# Patient Record
Sex: Female | Born: 1980 | Hispanic: Yes | Smoking: Former smoker
Health system: Southern US, Community
[De-identification: ages and names within clinical notes are randomized; demographics above are authoritative.]

## PROBLEM LIST (undated history)

## (undated) DIAGNOSIS — E282 Polycystic ovarian syndrome: Secondary | ICD-10-CM

## (undated) DIAGNOSIS — R87619 Unspecified abnormal cytological findings in specimens from cervix uteri: Secondary | ICD-10-CM

## (undated) HISTORY — DX: Unspecified abnormal cytological findings in specimens from cervix uteri: R87.619

## (undated) HISTORY — DX: Polycystic ovarian syndrome: E28.2

---
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
## 2000-09-14 ENCOUNTER — Other Ambulatory Visit: Admission: RE | Admit: 2000-09-14 | Discharge: 2000-09-14 | Payer: Self-pay | Admitting: Family Medicine

## 2001-10-03 ENCOUNTER — Other Ambulatory Visit: Admission: RE | Admit: 2001-10-03 | Discharge: 2001-10-03 | Payer: Self-pay | Admitting: Family Medicine

## 2002-06-06 ENCOUNTER — Other Ambulatory Visit: Admission: RE | Admit: 2002-06-06 | Discharge: 2002-06-06 | Payer: Self-pay | Admitting: Obstetrics and Gynecology

## 2002-12-04 ENCOUNTER — Other Ambulatory Visit: Admission: RE | Admit: 2002-12-04 | Discharge: 2002-12-04 | Payer: Self-pay | Admitting: Obstetrics and Gynecology

## 2003-07-06 ENCOUNTER — Other Ambulatory Visit: Admission: RE | Admit: 2003-07-06 | Discharge: 2003-07-06 | Payer: Self-pay | Admitting: Obstetrics and Gynecology

## 2004-10-01 ENCOUNTER — Other Ambulatory Visit: Admission: RE | Admit: 2004-10-01 | Discharge: 2004-10-01 | Payer: Self-pay | Admitting: Obstetrics and Gynecology

## 2005-04-12 ENCOUNTER — Emergency Department (HOSPITAL_COMMUNITY): Admission: EM | Admit: 2005-04-12 | Discharge: 2005-04-12 | Payer: Self-pay | Admitting: Emergency Medicine

## 2012-11-02 DIAGNOSIS — R87619 Unspecified abnormal cytological findings in specimens from cervix uteri: Secondary | ICD-10-CM

## 2012-11-02 HISTORY — DX: Unspecified abnormal cytological findings in specimens from cervix uteri: R87.619

## 2012-11-02 HISTORY — PX: CERVICAL BIOPSY  W/ LOOP ELECTRODE EXCISION: SUR135

## 2012-11-02 HISTORY — PX: COLPOSCOPY: SHX161

## 2016-09-22 ENCOUNTER — Encounter: Payer: Self-pay | Admitting: Certified Nurse Midwife

## 2016-09-22 ENCOUNTER — Ambulatory Visit (INDEPENDENT_AMBULATORY_CARE_PROVIDER_SITE_OTHER): Payer: BLUE CROSS/BLUE SHIELD | Admitting: Certified Nurse Midwife

## 2016-09-22 VITALS — BP 110/68 | HR 68 | Resp 16 | Ht 66.75 in | Wt 150.0 lb

## 2016-09-22 DIAGNOSIS — Z01419 Encounter for gynecological examination (general) (routine) without abnormal findings: Secondary | ICD-10-CM

## 2016-09-22 DIAGNOSIS — Z124 Encounter for screening for malignant neoplasm of cervix: Secondary | ICD-10-CM

## 2016-09-22 DIAGNOSIS — Z1151 Encounter for screening for human papillomavirus (HPV): Secondary | ICD-10-CM | POA: Diagnosis not present

## 2016-09-22 DIAGNOSIS — N926 Irregular menstruation, unspecified: Secondary | ICD-10-CM | POA: Diagnosis not present

## 2016-09-22 DIAGNOSIS — L689 Hypertrichosis, unspecified: Secondary | ICD-10-CM | POA: Diagnosis not present

## 2016-09-22 DIAGNOSIS — Z0000 Encounter for general adult medical examination without abnormal findings: Secondary | ICD-10-CM

## 2016-09-22 LAB — POCT URINALYSIS DIPSTICK
Bilirubin, UA: NEGATIVE
Glucose, UA: NEGATIVE
Ketones, UA: NEGATIVE
LEUKOCYTES UA: NEGATIVE
NITRITE UA: NEGATIVE
PH UA: 5
PROTEIN UA: NEGATIVE
UROBILINOGEN UA: NEGATIVE

## 2016-09-22 LAB — TSH: TSH: 2.2 m[IU]/L

## 2016-09-22 NOTE — Progress Notes (Signed)
35 y.o. G0P0000 Married  Caucasian Fe here to establish gyn care and  for annual exam. Periods are usually normal, went 4 months without period and the past two months have been normal. Has cramping day 1-2 days,duration 4 days, heavy day 1-2 days. Patient has been traveling a lot. Has had hair loss since mid 20's and has noted it is still the same. Has seen dermatology for and chose not to do Rogaine. Would like to have screening labs if possible.  Tries to eat healthy and not boxed foods. Also has had increased body hair on face and breasts. Would like evaluation if possible. No other health issues today.   Patient's last menstrual period was 08/24/2016 (exact date).          Sexually active: Yes.    The current method of family planning is condoms all the time.    Exercising: Yes.    outdoor sports Smoker:  no  Health Maintenance: Pap:  06-11-14 neg, hx of LEEP 2014 MMG:  none Colonoscopy:  none BMD:   none TDaP:  2013 Shingles: no Pneumonia: no Hep C and HIV: not done Labs: hgb-12.1, poct urine-tr Self breast exam: done occ   reports that she has quit smoking. She has never used smokeless tobacco. She reports that she drinks about 0.6 - 1.2 oz of alcohol per week . She reports that she does not use drugs.  Past Medical History:  Diagnosis Date  . Abnormal Pap smear of cervix 2014    Past Surgical History:  Procedure Laterality Date  . CERVICAL BIOPSY  W/ LOOP ELECTRODE EXCISION  2014  . COLPOSCOPY  2014    No current outpatient prescriptions on file.   No current facility-administered medications for this visit.     Family History  Problem Relation Age of Onset  . Breast cancer Mother   . Thyroid disease Mother   . Hypertension Father   . Breast cancer Maternal Aunt   . Breast cancer Maternal Aunt     ROS:  Pertinent items are noted in HPI.  Otherwise, a comprehensive ROS was negative.  Exam:   BP 110/68   Pulse 68   Resp 16   Ht 5' 6.75" (1.695 m)   Wt 150 lb  (68 kg)   LMP 08/24/2016 (Exact Date)   BMI 23.67 kg/m  Height: 5' 6.75" (169.5 cm) Ht Readings from Last 3 Encounters:  09/22/16 5' 6.75" (1.695 m)    General appearance: alert, cooperative and appears stated age Head: Normocephalic, without obvious abnormality, atraumatic Neck: no adenopathy, supple, symmetrical, trachea midline and thyroid normal to inspection and palpation Lungs: clear to auscultation bilaterally Breasts: normal appearance, no masses or tenderness, No nipple retraction or dimpling, No nipple discharge or bleeding, No axillary or supraclavicular adenopathy, Taught monthly breast self examination Heart: regular rate and rhythm Abdomen: soft, non-tender; no masses,  no organomegaly Extremities: extremities normal, atraumatic, no cyanosis or edema Skin: Skin color, texture, turgor normal. No rashes or lesions Lymph nodes: Cervical, supraclavicular, and axillary nodes normal. No abnormal inguinal nodes palpated Neurologic: Grossly normal   Pelvic: External genitalia:  no lesions              Urethra:  normal appearing urethra with no masses, tenderness or lesions              Bartholin's and Skene's: normal                 Vagina: normal appearing vagina with  normal color and discharge, no lesions              Cervix: no bleeding following Pap, no cervical motion tenderness, no lesions and nulliparous appearance              Pap taken: Yes.   Bimanual Exam:  Uterus:  normal size, contour, position, consistency, mobility, non-tender              Adnexa: normal adnexa and no mass, fullness, tenderness               Rectovaginal: Confirms               Anus:  normal sphincter tone, no lesions  Chaperone present: yes  A:  Well Woman with normal exam  Contraception condoms working well.  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 Discussed weight and endocrine changes that can affect periods.  Questions addressed. Keep menses calendar and advise if no period in 3 months.  Labs TSH,FSH ,Prolactin, DHEA, Estradiol, Progesterone, female testosterone   Patient has records from when she had LEEP and will bring them by for [place] to copy for her record. Stressed importance of follow up pap.  Pap smear as above with HPVHR  counseled on breast self exam, adequate intake of calcium and vitamin D, diet and exercise  return annually or prn  An After Visit Summary was printed and given to the patient.

## 2016-09-22 NOTE — Patient Instructions (Signed)

## 2016-09-23 LAB — DHEA-SULFATE: DHEA-SO4: 122 ug/dL (ref 23–266)

## 2016-09-23 LAB — FOLLICLE STIMULATING HORMONE: FSH: 4.3 m[IU]/mL

## 2016-09-23 LAB — PROLACTIN: Prolactin: 24.7 ng/mL

## 2016-09-23 LAB — TESTOSTERONE, TOTAL, LC/MS/MS: Testosterone, Total, LC-MS-MS: 23 ng/dL (ref 2–45)

## 2016-09-23 LAB — ESTRADIOL: ESTRADIOL: 43 pg/mL

## 2016-09-25 LAB — 17-HYDROXYPROGESTERONE: 17-OH-Progesterone, LC/MS/MS: 103 ng/dL

## 2016-09-25 NOTE — Progress Notes (Signed)
Encounter reviewed Jill Jertson, MD   

## 2016-09-28 LAB — IPS PAP TEST WITH HPV

## 2016-09-28 LAB — HEMOGLOBIN, FINGERSTICK: Hemoglobin, fingerstick: 12.1 g/dL (ref 12.0–16.0)

## 2016-10-06 ENCOUNTER — Ambulatory Visit: Payer: Self-pay | Admitting: Nurse Practitioner

## 2016-10-09 ENCOUNTER — Telehealth: Payer: Self-pay | Admitting: Certified Nurse Midwife

## 2016-10-09 NOTE — Telephone Encounter (Signed)
Covering for DL - my suggestion is for her to go back on OCP to regulate her cycles.  Seems as though hair loss was since her 20's even while she was on OCP.  So not the cause of hair loss - (now with and without still has the same thing).  Still recommend PCP for the hair loss.  OCP will help regulate her cycles and decrease dysmenorrhea.  Did she come off OCP to try for a pregnancy - no mention of this in DL note.  All hormonal test were normal.  Does she remember the OCP she previously took?  Needs to be on a low dose pill.

## 2016-10-09 NOTE — Telephone Encounter (Signed)
Spoke with patient. Patient calling in reference to labs as seen below. Patient states it is weird everything came back normal, was hoping not to expect normal. Patient states she still has no answers to the symptoms that she discussed at last AEX. Patient states she went to dermatologist 6 months or so and nothing is different. RN asked if patient has a pcp, patient states she has an appt coming up in January -advised to keep this appt to establish care. Advised patient would review with Melvia Heaps, CNM for any additional recommendations and return call. Patient states she was regularly on OCP since 35 years old and stopped 3-60yrs ago and wonders if maybe this can be related to the symptoms currently experiencing. Advised patient I would return call with any additional recommendations. Patient is agreeable.  Melvia Heaps, CNM -please advise?   Notes Recorded by Regina Eck, CNM on 10/01/2016 at 7:43 AM EST Good Morning Amber Ware, Your lab profile is as follows: Your Progesterone level is normal Total Testerone for a woman is within normal range no concerns for hair growth FSH is normal for your age Prolactin level Is normal, no Pituitary gland concerns Estradiol level is normal DHEA is normal for a female, with no concerns with hair growth TSH is normal Pap smear is negative and HPVHR not detected no concerns Repeat pap smear in one year 02 Hgb. Is normal Recommend continued follow up with Dermatology regarding hair loss and treatment if desired. If you miss 3 periods consecutively you should advise, I would want you to have an exam and some lab work, so please keep a period record. Questions are always welcomed and can schedule consult to discuss. Have a great day [name]

## 2016-10-09 NOTE — Telephone Encounter (Signed)
Patient would like to speak with nurse about results.

## 2016-10-12 MED ORDER — NORGESTIM-ETH ESTRAD TRIPHASIC 0.18/0.215/0.25 MG-25 MCG PO TABS: 1.0000 | ORAL_TABLET | Freq: Every day | ORAL | 3 refills | Status: DC

## 2016-10-12 NOTE — Telephone Encounter (Signed)
Left message to call Amber Ware at [phone].  

## 2016-10-12 NOTE — Telephone Encounter (Signed)
I prefer the Ortho tri Cyclen Lo to keep the hormone levels low.  Give her instructions for restarting and BUM, potential SE., etc.  Refill until next AEX with DL.

## 2016-10-12 NOTE — Telephone Encounter (Signed)
Spoke with patient, advised as seen below per Kem Boroughs, NP. Review BUM and side effects of OCP. Patient verbalzies understanding and is agreeable.  Routing to provider for final review. Patient is agreeable to disposition. Will close encounter.  Cc: Melvia Heaps, CNM

## 2016-10-12 NOTE — Telephone Encounter (Signed)
Left message to call Taneil Lazarus at [phone].  

## 2016-10-12 NOTE — Telephone Encounter (Signed)
Patient is returning a call to Jill. °

## 2016-10-12 NOTE — Telephone Encounter (Signed)
Spoke with patient, advised as seen below. Patient states no reasons for coming off of the OCP other than she wanted to see how her body would do without it. Patient states she did not want to miss an underyling issue, but since all testing has come back normal, she would be ok with restarting OCP. Patient reports she has used Ortho-tri-cyclen and Ortho-tri-cyclenLo in the past. Advised patient RN would update Kem Boroughs, NP and return call with OCP recommendation. Verified pharmacy on file. Patient is agreeable.   Kem Boroughs, NP -please advise on OCP?

## 2016-11-09 ENCOUNTER — Encounter: Payer: Self-pay | Admitting: Nurse Practitioner

## 2016-11-09 ENCOUNTER — Ambulatory Visit (INDEPENDENT_AMBULATORY_CARE_PROVIDER_SITE_OTHER): Payer: BLUE CROSS/BLUE SHIELD | Admitting: Nurse Practitioner

## 2016-11-09 ENCOUNTER — Other Ambulatory Visit (INDEPENDENT_AMBULATORY_CARE_PROVIDER_SITE_OTHER): Payer: BLUE CROSS/BLUE SHIELD

## 2016-11-09 VITALS — BP 120/82 | HR 84 | Temp 98.4°F | Resp 12 | Ht 66.0 in | Wt 153.1 lb

## 2016-11-09 DIAGNOSIS — L689 Hypertrichosis, unspecified: Secondary | ICD-10-CM

## 2016-11-09 DIAGNOSIS — N926 Irregular menstruation, unspecified: Secondary | ICD-10-CM | POA: Diagnosis not present

## 2016-11-09 LAB — COMPREHENSIVE METABOLIC PANEL
ALT: 13 U/L (ref 0–35)
AST: 17 U/L (ref 0–37)
Albumin: 4.6 g/dL (ref 3.5–5.2)
Alkaline Phosphatase: 63 U/L (ref 39–117)
BILIRUBIN TOTAL: 0.6 mg/dL (ref 0.2–1.2)
BUN: 17 mg/dL (ref 6–23)
CHLORIDE: 105 meq/L (ref 96–112)
CO2: 29 meq/L (ref 19–32)
CREATININE: 0.73 mg/dL (ref 0.40–1.20)
Calcium: 10.4 mg/dL (ref 8.4–10.5)
GFR: 96.39 mL/min (ref >60.00)
GLUCOSE: 82 mg/dL (ref 70–99)
Potassium: 4.8 mEq/L (ref 3.5–5.1)
Sodium: 141 mEq/L (ref 135–145)
Total Protein: 7.7 g/dL (ref 6.0–8.3)

## 2016-11-09 LAB — CBC WITH DIFFERENTIAL/PLATELET
BASOS ABS: 0 10*3/uL (ref 0.0–0.1)
BASOS PCT: 0.3 % (ref 0.0–3.0)
EOS ABS: 0.1 10*3/uL (ref 0.0–0.7)
Eosinophils Relative: 1.1 % (ref 0.0–5.0)
HCT: 36.3 % (ref 36.0–46.0)
Hemoglobin: 12.7 g/dL (ref 12.0–15.0)
LYMPHS ABS: 1.5 10*3/uL (ref 0.7–4.0)
Lymphocytes Relative: 27.6 % (ref 12.0–46.0)
MCHC: 35 g/dL (ref 30.0–36.0)
MCV: 87.4 fl (ref 78.0–100.0)
Monocytes Absolute: 0.3 10*3/uL (ref 0.1–1.0)
Monocytes Relative: 6.5 % (ref 3.0–12.0)
NEUTROS ABS: 3.5 10*3/uL (ref 1.4–7.7)
NEUTROS PCT: 64.5 % (ref 43.0–77.0)
PLATELETS: 316 10*3/uL (ref 150.0–400.0)
RBC: 4.16 Mil/uL (ref 3.87–5.11)
RDW: 12.6 % (ref 11.5–15.5)
WBC: 5.4 10*3/uL (ref 4.0–10.5)

## 2016-11-09 LAB — TSH: TSH: 1.41 u[IU]/mL (ref 0.35–4.50)

## 2016-11-09 LAB — HEMOGLOBIN A1C: HEMOGLOBIN A1C: 5.2 % (ref 4.6–6.5)

## 2016-11-09 LAB — T4, FREE: FREE T4: 0.71 ng/dL (ref 0.60–1.60)

## 2016-11-09 NOTE — Patient Instructions (Addendum)
Go to basement for blood draw.  You will be called with results and appt for ultrasound.

## 2016-11-09 NOTE — Progress Notes (Signed)
Normal results, see office note

## 2016-11-09 NOTE — Progress Notes (Signed)
Pre visit review using our clinic review tool, if applicable. No additional management support is needed unless otherwise documented below in the visit note. 

## 2016-11-09 NOTE — Progress Notes (Signed)
Subjective:    Patient ID: Amber Ware, female    DOB: 1981-08-09, 36 y.o.   MRN: [id number]  Patient presents today for establish care (new patient) to discuss excess hair growth and irregular menstrual cycle.  HPI Irregular menstrual cycle: Used birth control pills x 66yr, then stopped 2years ago, had menstrual cycle for 4574month then became irregular. Last menstrual cycle was 74m51monthgo. Hormonal testing done by [facility] 09/2016: normal levels. No FH of PCOS. Does not know mother's age of menopause. Does not have any siblings.  Hair growth on stomach and legs have become coarse and excessive for last 2years. No abnormal weight loss or gain.  Immunizations: (TDAP, Hep C screen, Pneumovax, Influenza, zoster)  Health Maintenance  Topic Date Due  . HIV Screening  11/08/2017*  . Flu Shot  11/09/2017*  . Pap Smear  09/23/2019  . Tetanus Vaccine  11/02/2021  *Topic was postponed. The date shown is not the original due date.   Diet:healthy Weight:  Wt Readings from Last 3 Encounters:  11/09/16 153 lb 1.9 oz (69.5 kg)  09/22/16 150 lb (68 kg)   Exercise:daily Fall Risk:denies No flowsheet data found. Home Safety:home with husband Depression/Suicide:denies No flowsheet data found. No flowsheet data found. Pap Smear (every 29yr20yrr >21-29 without HPV, every 50yrs71yr >30-650yrs1yr HPV):up to date Vision: up to date Dental:up to date Sexual History (birth control, marital status, STD):sexually active, use of condoms at this time  Medications and allergies reviewed with patient and updated if appropriate.  There are no active problems to display for this patient.   Current Outpatient Prescriptions on File Prior to Visit  Medication Sig Dispense Refill  . Norgestimate-Ethinyl Estradiol Triphasic (ORTHO TRI-CYCLEN LO) 0.18/0.215/0.25 MG-25 MCG tab Take 1 tablet by mouth daily. (Patient not taking: Reported on 11/09/2016) 3 Package 3   No current facility-administered  medications on file prior to visit.     Past Medical History:  Diagnosis Date  . Abnormal Pap smear of cervix 2014    Past Surgical History:  Procedure Laterality Date  . CERVICAL BIOPSY  W/ LOOP ELECTRODE EXCISION  2014  . COLPOSCOPY  2014    Social History   Social History  . Marital status: Married    Spouse name: N/A  . Number of children: N/A  . Years of education: N/A   Social History Main Topics  . 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 Alcohol use 0.6 - 1.2 oz/week    1 - 2 Standard drinks or equivalent per week  . Drug use: No  . Sexual activity: Yes    Partners: Male    Birth control/ protection: Condom   Other Topics Concern  . None   Social History Narrative  . None    Family History  Problem Relation Age of Onset  . Breast cancer Mother 62    106ill alive  . Thyroid disease Mother   . Hypertension Father   . Breast cancer Maternal Aunt 50  . 23east cancer Maternal Aunt 55        Review of Systems  Constitutional: Negative for fever, malaise/fatigue and weight loss.  HENT: Negative for congestion and sore throat.   Eyes:       Negative for visual changes  Respiratory: Negative for cough and shortness of breath.   Cardiovascular: Negative for chest pain, palpitations and leg swelling.  Gastrointestinal: Negative for blood in stool, constipation, diarrhea and heartburn.  Genitourinary: Negative  for dysuria, frequency and urgency.  Musculoskeletal: Negative for falls, joint pain and myalgias.  Skin: Negative for rash.  Neurological: Negative for dizziness, sensory change and headaches.  Endo/Heme/Allergies: Negative for environmental allergies and polydipsia. Does not bruise/bleed easily.  Psychiatric/Behavioral: Negative for depression, substance abuse and suicidal ideas. The patient is not nervous/anxious and does not have insomnia.     Objective:   Vitals:   11/09/16 0903  BP: 120/82  Pulse: 84  Resp: 12    Temp: 98.4 F (36.9 C)    Body mass index is 24.71 kg/m.   Physical Examination:  Physical Exam  Constitutional: She is oriented to person, place, and time and well-developed, well-nourished, and in no distress. No distress.  HENT:  Right Ear: External ear normal.  Left Ear: External ear normal.  Nose: Nose normal.  Mouth/Throat: Oropharynx is clear and moist. No oropharyngeal exudate.  Eyes: Conjunctivae and EOM are normal. Pupils are equal, round, and reactive to light. No scleral icterus.  Neck: Normal range of motion. Neck supple. No thyromegaly present.  Cardiovascular: Normal rate, normal heart sounds and intact distal pulses.   Pulmonary/Chest: Effort normal and breath sounds normal. She exhibits no tenderness.  Abdominal: Soft. Bowel sounds are normal. She exhibits no distension. There is no tenderness.  Musculoskeletal: Normal range of motion. She exhibits no edema or tenderness.  Lymphadenopathy:    She has no cervical adenopathy.  Neurological: She is alert and oriented to person, place, and time. Gait normal.  Skin: Skin is warm and dry. No rash noted. No erythema.  Psychiatric: Affect and judgment normal.  Vitals reviewed.   ASSESSMENT and PLAN:  Amber Ware was seen today for establish care.  Diagnoses and all orders for this visit:  Irregular menstrual cycle -     Cancel: CBC w/Diff; Future -     Cancel: TSH; Future -     Cancel: T4, free; Future -     Cancel: Comp Met (CMET); Future -     Cancel: Hemoglobin A1c; Future -     US Transvaginal Non-OB; Future -     CBC w/Diff; Future -     Comp Met (CMET); Future -     TSH; Future -     T4, free; Future -     Hemoglobin A1c; Future -     US Pelvis Complete; Future  Excessive hair growth -     Cancel: CBC w/Diff; Future -     Cancel: TSH; Future -     Cancel: T4, free; Future -     Cancel: Comp Met (CMET); Future -     Cancel: Hemoglobin A1c; Future -     US Transvaginal Non-OB; Future -     CBC  w/Diff; Future -     Comp Met (CMET); Future -     TSH; Future -     T4, free; Future -     Hemoglobin A1c; Future -     US Pelvis Complete; Future   No problem-specific Assessment & Plan notes found for this encounter.     Follow up: Return in about 1 year (around 11/09/2017) for CPE.  Wilfred Lacy, NP

## 2016-11-10 ENCOUNTER — Ambulatory Visit
Admission: RE | Admit: 2016-11-10 | Discharge: 2016-11-10 | Disposition: A | Discharge status: Home / Self Care | Payer: BLUE CROSS/BLUE SHIELD | Source: Ambulatory Visit | Attending: Nurse Practitioner | Admitting: Nurse Practitioner

## 2016-11-10 DIAGNOSIS — L689 Hypertrichosis, unspecified: Secondary | ICD-10-CM | POA: Insufficient documentation

## 2016-11-10 DIAGNOSIS — N926 Irregular menstruation, unspecified: Secondary | ICD-10-CM | POA: Insufficient documentation

## 2016-11-13 ENCOUNTER — Encounter: Payer: Self-pay | Admitting: Certified Nurse Midwife

## 2016-11-13 ENCOUNTER — Other Ambulatory Visit: Payer: Self-pay | Admitting: Certified Nurse Midwife

## 2016-11-13 ENCOUNTER — Ambulatory Visit (INDEPENDENT_AMBULATORY_CARE_PROVIDER_SITE_OTHER): Payer: BLUE CROSS/BLUE SHIELD | Admitting: Certified Nurse Midwife

## 2016-11-13 VITALS — BP 96/62 | HR 68 | Resp 16 | Ht 66.75 in | Wt 149.0 lb

## 2016-11-13 DIAGNOSIS — N912 Amenorrhea, unspecified: Secondary | ICD-10-CM | POA: Diagnosis not present

## 2016-11-13 DIAGNOSIS — E282 Polycystic ovarian syndrome: Secondary | ICD-10-CM | POA: Diagnosis not present

## 2016-11-13 LAB — POCT URINE PREGNANCY: PREG TEST UR: NEGATIVE

## 2016-11-13 NOTE — Patient Instructions (Signed)
Please call if you start your period prior to lab results return. We will call you with results. [name]

## 2016-11-13 NOTE — Progress Notes (Signed)
36 y.o. Married Caucasian female G0P0000 here for follow up of cycle changes. Has not had period since 08/24/16. Patient was seen as new patient and had ran out pills and not restarted due to amenorrhea. Was see for aex with PCP and was evaluated 11/09/16 with PUS and labs. Patient was told to follow up with Gyn. Patient has felt like she may start a period, but no bleeding.Contraception consistent condom use and negative UPT today. Does not want to be pregnant. PUS showed PCOS characteristics and patient would like to be back with normal cycles which she had with OCP use. Extra hair growth still continues but no increase. No other health concerns today.   O: Healthy WD,WN female Affect: Normal, orientation x 3 Skin: warm and dry Abdomen:soft, non tender Pelvic exam:EXTERNAL GENITALIA: normal appearing vulva with no masses, tenderness or lesions VAGINA: no abnormal discharge or lesions CERVIX: no lesions or cervical motion tenderness and normal appearance UTERUS: non tender, no masses, normal ADNEXA: no masses palpable and nontender  A:Amenorrhea negative UPT Recent PUS with PCOS characteristics noted with ovaries Hirsuitism History of irregular periods without OCP use, desires OCP for contraception Screening labs as needed.   P: Discussed findings of normal pelvic exam. Discussed findings of PUS and PCOS(reviewed by Dr. Sabra Heck with PCOS characteristics on images) and she has characteristics also with excessive hair growth and irregular periods. Questions addressed regarding management with OCP for amenorrhea episodes and possible Spiralactone use with OCP for help with hair control. Discussed sometimes Glucophage needed for concerns with glucose. Her screening was normal, so not indicated at this point.  Primary concern at this point is to make sure no risk of hyperplasia with no menses in 3 months. PUS showed thickened endometrium, 7.42mm so will need to go ahead with Provera challenge and with  bleeding can restart OCP. Patient voiced understanding.  Questions addressed. Lab: Free and total testosterone female  Will call patient with further recommendations when lab in.  RV prn

## 2016-11-16 LAB — TESTOSTERONE, FREE AND TOTAL (INCLUDES SHBG)-(MALES)
SEX HORMONE BINDING: 39 nmol/L (ref 17–124)
TESTOSTERONE FREE: 3.7 pg/mL (ref 0.6–6.8)
TESTOSTERONE-% FREE: 1.6 % (ref 0.4–2.4)
TESTOSTERONE: 23 ng/dL

## 2016-11-17 ENCOUNTER — Telehealth: Payer: Self-pay

## 2016-11-17 ENCOUNTER — Other Ambulatory Visit: Payer: Self-pay | Admitting: Certified Nurse Midwife

## 2016-11-17 DIAGNOSIS — N912 Amenorrhea, unspecified: Secondary | ICD-10-CM

## 2016-11-17 DIAGNOSIS — Z3202 Encounter for pregnancy test, result negative: Secondary | ICD-10-CM

## 2016-11-17 MED ORDER — MEDROXYPROGESTERONE ACETATE 10 MG PO TABS: 10.0000 mg | ORAL_TABLET | Freq: Every day | ORAL | 0 refills | Status: DC

## 2016-11-17 NOTE — Telephone Encounter (Signed)
-----   Message from Regina Eck, CNM sent at 11/17/2016  3:04 PM EST ----- Notify patient that testosterone no change since last evaluation, free testosterone normal range Need do Provera challenge to see if we can stimulate period. Rx Provera 10 mg for 10 days. When bleeding starts needs to advise so she can start on her OCP's. No order in yet. If  No bleeding before by 2 weeks after completion, needs to advise. Rx Provera order in. Please give instructions. Make sure she has used consistent condoms.

## 2016-11-17 NOTE — Telephone Encounter (Signed)
lmtcb

## 2016-11-19 NOTE — Progress Notes (Signed)
Encounter reviewed Jill Jertson, MD   

## 2016-11-20 NOTE — Telephone Encounter (Signed)
Patient returned call

## 2016-11-20 NOTE — Telephone Encounter (Signed)
Left message for call back.

## 2016-11-20 NOTE — Telephone Encounter (Signed)
Patient notified of results as written by provider 

## 2016-12-03 ENCOUNTER — Telehealth: Payer: Self-pay | Admitting: Certified Nurse Midwife

## 2016-12-03 NOTE — Telephone Encounter (Signed)
Spoke with patient. Patient states she was started on provera 11/17/16 to help start cycle. Patient states cycle started today. Patient would like to know if she should restart OrthoTri-cyclen Lo that she already has or will she need to change OCP? Patient states she talked with Melvia Heaps, CNM about possibly switching. Patient sates she has used condoms for contraception. Advised patient would review with Melvia Heaps, CNM and return call with recommendations, patient is agreeable. See telephone encounter dated 11/17/16.   Melvia Heaps, CNM- please advise?

## 2016-12-03 NOTE — Telephone Encounter (Signed)
She can go ahead and restart her Ortho Tricyclen Lo which has a low androgen content, which will help with increase hair growth change. Does she need refills. Needs to advise if no period with OCP use after the second pack of pills.

## 2016-12-03 NOTE — Telephone Encounter (Signed)
Spoke with patient. Advised as seen below per Melvia Heaps, CNM. Patient states she has 3 pkgs of Ortho Tricyclen Lo, will start with that and return call when refill needed or if no cycle. Patient verbalizes understanding and is agreeable.  Routing to provider for final review. Patient is agreeable to disposition. Will close encounter.

## 2016-12-03 NOTE — Telephone Encounter (Signed)
Patient is calling to give an update.

## 2017-09-28 ENCOUNTER — Other Ambulatory Visit: Payer: Self-pay

## 2017-09-28 ENCOUNTER — Encounter: Payer: Self-pay | Admitting: Certified Nurse Midwife

## 2017-09-28 ENCOUNTER — Ambulatory Visit (INDEPENDENT_AMBULATORY_CARE_PROVIDER_SITE_OTHER): Payer: BLUE CROSS/BLUE SHIELD | Admitting: Certified Nurse Midwife

## 2017-09-28 VITALS — BP 100/70 | HR 64 | Resp 16 | Ht 66.5 in | Wt 157.0 lb

## 2017-09-28 DIAGNOSIS — Z8669 Personal history of other diseases of the nervous system and sense organs: Secondary | ICD-10-CM | POA: Diagnosis not present

## 2017-09-28 DIAGNOSIS — Z3041 Encounter for surveillance of contraceptive pills: Secondary | ICD-10-CM

## 2017-09-28 DIAGNOSIS — Z01419 Encounter for gynecological examination (general) (routine) without abnormal findings: Secondary | ICD-10-CM | POA: Diagnosis not present

## 2017-09-28 DIAGNOSIS — R6882 Decreased libido: Secondary | ICD-10-CM | POA: Insufficient documentation

## 2017-09-28 NOTE — Patient Instructions (Addendum)
EXERCISE AND DIET:  We recommended that you start or continue a regular exercise program for good health. Regular exercise means any activity that makes your heart beat faster and makes you sweat.  We recommend exercising at least 30 minutes per day at least 3 days a week, preferably 4 or 5.  We also recommend a diet low in fat and sugar.  Inactivity, poor dietary choices and obesity can cause diabetes, heart attack, stroke, and kidney damage, among others.    ALCOHOL AND SMOKING:  Women should limit their alcohol intake to no more than 7 drinks/beers/glasses of wine (combined, not each!) per week. Moderation of alcohol intake to this level decreases your risk of breast cancer and liver damage. And of course, no recreational drugs are part of a healthy lifestyle.  And absolutely no smoking or even second hand smoke. Most people know smoking can cause heart and lung diseases, but did you know it also contributes to weakening of your bones? Aging of your skin?  Yellowing of your teeth and nails?  CALCIUM AND VITAMIN D:  Adequate intake of calcium and Vitamin D are recommended.  The recommendations for exact amounts of these supplements seem to change often, but generally speaking 600 mg of calcium (either carbonate or citrate) and 800 units of Vitamin D per day seems prudent. Certain women may benefit from higher intake of Vitamin D.  If you are among these women, your doctor will have told you during your visit.    PAP SMEARS:  Pap smears, to check for cervical cancer or precancers,  have traditionally been done yearly, although recent scientific advances have shown that most women can have pap smears less often.  However, every woman still should have a physical exam from her gynecologist every year. It will include a breast check, inspection of the vulva and vagina to check for abnormal growths or skin changes, a visual exam of the cervix, and then an exam to evaluate the size and shape of the uterus and  ovaries.  And after 36 years of age, a rectal exam is indicated to check for rectal cancers. We will also provide age appropriate advice regarding health maintenance, like when you should have certain vaccines, screening for sexually transmitted diseases, bone density testing, colonoscopy, mammograms, etc.   MAMMOGRAMS:  All women over 40 years old should have a yearly mammogram. Many facilities now offer a "3D" mammogram, which may cost around $50 extra out of pocket. If possible,  we recommend you accept the option to have the 3D mammogram performed.  It both reduces the number of women who will be called back for extra views which then turn out to be normal, and it is better than the routine mammogram at detecting truly abnormal areas.    COLONOSCOPY:  Colonoscopy to screen for colon cancer is recommended for all women at age 50.  We know, you hate the idea of the prep.  We agree, BUT, having colon cancer and not knowing it is worse!!  Colon cancer so often starts as a polyp that can be seen and removed at colonscopy, which can quite literally save your life!  And if your first colonoscopy is normal and you have no family history of colon cancer, most women don't have to have it again for 10 years.  Once every ten years, you can do something that may end up saving your life, right?  We will be happy to help you get it scheduled when you are ready.    Be sure to check your insurance coverage so you understand how much it will cost.  It may be covered as a preventative service at no cost, but you should check your particular policy.      Migraine Headache A migraine headache is a very strong throbbing pain on one side or both sides of your head. Migraines can also cause other symptoms. Talk with your doctor about what things may bring on (trigger) your migraine headaches. Follow these instructions at home: Medicines  Take over-the-counter and prescription medicines only as told by your doctor.  Do not  drive or use heavy machinery while taking prescription pain medicine.  To prevent or treat constipation while you are taking prescription pain medicine, your doctor may recommend that you: ? Drink enough fluid to keep your pee (urine) clear or pale yellow. ? Take over-the-counter or prescription medicines. ? Eat foods that are high in fiber. These include fresh fruits and vegetables, whole grains, and beans. ? Limit foods that are high in fat and processed sugars. These include fried and sweet foods. Lifestyle  Avoid alcohol.  Do not use any products that contain nicotine or tobacco, such as cigarettes and e-cigarettes. If you need help quitting, ask your doctor.  Get at least 8 hours of sleep every night.  Limit your stress. General instructions   Keep a journal to find out what may bring on your migraines. For example, write down: ? What you eat and drink. ? How much sleep you get. ? Any change in what you eat or drink. ? Any change in your medicines.  If you have a migraine: ? Avoid things that make your symptoms worse, such as bright lights. ? It may help to lie down in a dark, quiet room. ? Do not drive or use heavy machinery. ? Ask your doctor what activities are safe for you.  Keep all follow-up visits as told by your doctor. This is important. Contact a doctor if:  You get a migraine that is different or worse than your usual migraines. Get help right away if:  Your migraine gets very bad.  You have a fever.  You have a stiff neck.  You have trouble seeing.  Your muscles feel weak or like you cannot control them.  You start to lose your balance a lot.  You start to have trouble walking.  You pass out (faint). This information is not intended to replace advice given to you by your health care provider. Make sure you discuss any questions you have with your health care provider. Document Released: 07/28/2008 Document Revised: 05/08/2016 Document Reviewed:  04/06/2016 Elsevier Interactive Patient Education  2017 [employer].

## 2017-09-28 NOTE — Progress Notes (Signed)
36 y.o. G0P0000 Married  Caucasian Fe here for annual exam.  Contraception OCP working well. No missed periods or missed pills. Cycles so much better.Amber Ware PCP [facility] prn. Has noted increase in headache occurrence random. Now having migraines every 4 months again. No aura ever. Maybe stress related. Having more muscle/joint pain with standing at work. Plans to try Massage therapy for head and neck muscle strain. Feels this maybe cause of her headaches. Eating well and good fluid intake and no excessive caffeine use. No other health issues today.   LMP 08/26/17 Sexually active: Yes.    The current method of family planning is OCP (estrogen/progesterone).    Exercising: Yes.    walk & run Smoker:  no  Health Maintenance: Pap:  06-11-14 neg, 09-22-16 neg HPV HR neg History of Abnormal Pap: yes LEEP MMG:  none Self Breast exams: yes Colonoscopy:  none BMD:   none TDaP:  2013 Shingles: no Pneumonia: no Hep C and HIV: not done Labs: if needed   reports that she has quit smoking. she has never used smokeless tobacco. She reports that she does not drink alcohol or use drugs.  Past Medical History:  Diagnosis Date  . Abnormal Pap smear of cervix 2014  . Polycystic ovarian syndrome     Past Surgical History:  Procedure Laterality Date  . CERVICAL BIOPSY  W/ LOOP ELECTRODE EXCISION  2014  . COLPOSCOPY  2014    Current Outpatient Medications  Medication Sig Dispense Refill  . Norgestim-Eth Estrad Triphasic (ORTHO TRI-CYCLEN LO PO) Take by mouth.     No current facility-administered medications for this visit.     Family History  Problem Relation Age of Onset  . Breast cancer Mother 24       still alive  . Thyroid disease Mother   . Hypertension Father   . Breast cancer Maternal Aunt 101  . Breast cancer Maternal Aunt 55    ROS:  Pertinent items are noted in HPI.  Otherwise, a comprehensive ROS was negative.  Exam:   BP 100/70   Pulse 64   Resp 16   Ht 5' 6.5"  (1.689 m)   Wt 157 lb (71.2 kg)   LMP  (Approximate) Comment: last week of the october  BMI 24.96 kg/m  Height: 5' 6.5" (168.9 cm) Ht Readings from Last 3 Encounters:  09/28/17 5' 6.5" (1.689 m)  11/13/16 5' 6.75" (1.695 m)  11/09/16 5\' 6"  (1.676 m)    General appearance: alert, cooperative and appears stated age Head: Normocephalic, without obvious abnormality, atraumatic Neck: no adenopathy, supple, symmetrical, trachea midline and thyroid normal to inspection and palpation Lungs: clear to auscultation bilaterally Breasts: normal appearance, no masses or tenderness, No nipple retraction or dimpling, No nipple discharge or bleeding, No axillary or supraclavicular adenopathy Heart: regular rate and rhythm Abdomen: soft, non-tender; no masses,  no organomegaly Extremities: extremities normal, atraumatic, no cyanosis or edema Skin: Skin color, texture, turgor normal. No rashes or lesions Lymph nodes: Cervical, supraclavicular, and axillary nodes normal. No abnormal inguinal nodes palpated Neurologic: Grossly normal   Pelvic: External genitalia:  no lesions              Urethra:  normal appearing urethra with no masses, tenderness or lesions              Bartholin's and Skene's: normal                 Vagina: normal appearing vagina with normal color  and discharge, no lesions              Cervix: no cervical motion tenderness, no lesions and nulliparous appearance              Pap taken: No. Bimanual Exam:  Uterus:  normal size, contour, position, consistency, mobility, non-tender              Adnexa: normal adnexa and no mass, fullness, tenderness               Rectovaginal: Confirms               Anus:  normal sphincter tone, no lesions  Chaperone present: yes  A:  Well Woman with normal exam  Contraception OCP desires continuance  Migraine headache no aura history, ? Muscle headache now with prolonged standing  History of LEEP with abnormal pap  P:   Reviewed health and  wellness pertinent to exam  Risks/benefits/warning signs reviewed .  Rx Ortho tricyclen lo see order with instructions  Discussed evaluation of headaches if continue or therapeutic massage trial. Warning signs of migraine given and need to advise if occurs or see PCP.  Pap smear: no   counseled on breast self exam, use and side effects of OCP's, adequate intake of calcium and vitamin D, diet and exercise  return annually or prn  An After Visit Summary was printed and given to the patient.

## 2017-10-15 ENCOUNTER — Other Ambulatory Visit: Payer: Self-pay

## 2017-10-15 NOTE — Telephone Encounter (Signed)
Medication refill request: ORTHO TRI-CYCLEN LO Last AEX:  09/28/17 DL Next AEX: 10/13/18 Last MMG (if hormonal medication request): n/a Refill authorized: please advise on refill

## 2017-10-16 MED ORDER — NORGESTIM-ETH ESTRAD TRIPHASIC 0.18/0.215/0.25 MG-25 MCG PO TABS: 1.0000 | ORAL_TABLET | Freq: Every day | ORAL | 4 refills | Status: DC

## 2018-01-14 ENCOUNTER — Ambulatory Visit (INDEPENDENT_AMBULATORY_CARE_PROVIDER_SITE_OTHER): Payer: BLUE CROSS/BLUE SHIELD | Admitting: Nurse Practitioner

## 2018-01-14 ENCOUNTER — Encounter: Payer: Self-pay | Admitting: Nurse Practitioner

## 2018-01-14 VITALS — BP 124/80 | HR 94 | Temp 98.6°F | Resp 16 | Ht 66.5 in | Wt 159.1 lb

## 2018-01-14 DIAGNOSIS — R519 Headache, unspecified: Secondary | ICD-10-CM

## 2018-01-14 DIAGNOSIS — M79606 Pain in leg, unspecified: Secondary | ICD-10-CM

## 2018-01-14 DIAGNOSIS — F419 Anxiety disorder, unspecified: Secondary | ICD-10-CM

## 2018-01-14 DIAGNOSIS — F329 Major depressive disorder, single episode, unspecified: Secondary | ICD-10-CM

## 2018-01-14 DIAGNOSIS — F32A Depression, unspecified: Secondary | ICD-10-CM | POA: Insufficient documentation

## 2018-01-14 DIAGNOSIS — M542 Cervicalgia: Secondary | ICD-10-CM | POA: Diagnosis not present

## 2018-01-14 DIAGNOSIS — R51 Headache: Secondary | ICD-10-CM

## 2018-01-14 DIAGNOSIS — F411 Generalized anxiety disorder: Secondary | ICD-10-CM | POA: Insufficient documentation

## 2018-01-14 MED ORDER — CYCLOBENZAPRINE HCL 5 MG PO TABS: 5.0000 mg | ORAL_TABLET | Freq: Three times a day (TID) | ORAL | 0 refills | Status: DC | PRN

## 2018-01-14 MED ORDER — SERTRALINE HCL 50 MG PO TABS: 50.0000 mg | ORAL_TABLET | Freq: Every day | ORAL | 1 refills | Status: DC

## 2018-01-14 NOTE — Assessment & Plan Note (Signed)
We discussed that her untreated anxiety and depression may be contributing to her headaches and overall not feeling well. Discussed trialing SSRI and referral to counseling to treat anxiety and depression and she was agreeable Will try zoloft-dosing and side effects discussed We also discussed nonpharm techniques to manage hypertension-See AVS for additional information provided to patient RTC in 1 month for follow up - sertraline (ZOLOFT) 50 MG tablet; Take 1 tablet (50 mg total) by mouth daily.  Dispense: 30 tablet; Refill: 1 - Ambulatory referral to Psychology

## 2018-01-14 NOTE — Progress Notes (Signed)
Name: Amber Ware   MRN: [id number]    DOB: 1981-07-06   Date:01/14/2018       Progress Note  Subjective  Chief Complaint  Chief Complaint  Patient presents with  . Establish Care    aches and pains in legs, veins in legs, headaches more often    HPI Ms Amber Ware is establishing care with me today, transferring from a prior provider who left our clinic. She is requesting evaluation of acute complaints today including anxiety and depression, headache, leg pain.  Anxiety and depression- This is a chronic problem. She actually saw a psychiatrist in her 6's for anxiety and depression and was given samples of several medications to try but she did not like the way any of the medications made her feel. She says she was a heavy alcohol and recreational drug user at that time In her life and felt the psych medications just made her feel worse. She says she has noticed increase in her anxiety and depression this year after moving to [place] from [place]. She says she did not want to move to [place] and does not really enjoy living here but had to move. She says she feels hopeless, has no energy and little interest in doing things on a daily basis. She denies restlessness, hallucinations, paranoia, thoughts of hurting herself or others.  Depression screen [facility] 2/9 01/14/2018 01/14/2018  Decreased Interest 3 0  Down, Depressed, Hopeless 3 0  PHQ - 2 Score 6 0  Altered sleeping 1 -  Tired, decreased energy 2 -  Change in appetite 0 -  Feeling bad or failure about yourself  1 -  Trouble concentrating 2 -  Moving slowly or fidgety/restless 0 -  Suicidal thoughts 0 -  PHQ-9 Score 12 -   GAD 7 : Generalized Anxiety Score 01/14/2018  Nervous, Anxious, on Edge 1  Control/stop worrying 0  Worry too much - different things 0  Trouble relaxing 1  Restless 0  Easily annoyed or irritable 3  Afraid - awful might happen 0  Total GAD 7 Score 5   Headache- This is not a new problem She has  experienced intermittent headaches throughout her life For the past 1-2 years headaches have seemed more frequent- about 1-2 headaches per week She describes the headaches as a constant dull pain in the right side and back of her head. The headaches are worse with light, noise and make her feel nauseated. Tried warm packs, aleve, massage with no relief. The headaches will resolve with time-after lying in a quiet dark room. She has been diagnosed with migraines in the past but has not been on medication for migraines. Shes also had pain in her right neck for several weeks now which she describes as tight muscle spasm She denies any fevers, confusion, syncope, dizziness, vision changes, sinus pressure, vomiting.  Leg pain- This is a chronic problem. This problem has increased over past year. She describes as a heaviness and aching in both of her legs The pain is worse after exercise or at the end of a long shift standing. She reports varicose and spider veins to her legs She has been wearing compression tights with some relief. She denies fevers, shortness of breath, back pain, erythema, bruising, warmth, leg swelling.   Patient Active Problem List   Diagnosis Date Noted  . Decreased sex drive 61/44/3154  . Excessive hair growth 11/10/2016  . Irregular menstrual cycle 11/10/2016    Past Surgical History:  Procedure Laterality Date  .  CERVICAL BIOPSY  W/ LOOP ELECTRODE EXCISION  2014  . COLPOSCOPY  2014    Family History  Problem Relation Age of Onset  . Breast cancer Mother 40       still alive  . Thyroid disease Mother   . Hypertension Father   . Breast cancer Maternal Aunt 75  . Breast cancer Maternal Aunt 47    Social History   Socioeconomic History  . Marital status: Married    Spouse name: Not on file  . Number of children: Not on file  . Years of education: Not on file  . Highest education level: Not on file  Social Needs  . Financial resource strain: Not on  file  . Food insecurity - worry: Not on file  . Food insecurity - inability: Not on file  . Transportation needs - medical: Not on file  . Transportation needs - non-medical: Not on file  Occupational History  . Not on file  Tobacco Use  . Smoking status: Former Research scientist (life sciences)  . Smokeless tobacco: Never Used  Substance and Sexual Activity  . Alcohol use: No    Frequency: Never  . Drug use: No  . Sexual activity: Yes    Partners: Male    Birth control/protection: Pill  Other Topics Concern  . Not on file  Social History Narrative  . Not on file     Current Outpatient Medications:  .  Norgestimate-Ethinyl Estradiol Triphasic (ORTHO TRI-CYCLEN LO) 0.18/0.215/0.25 MG-25 MCG tab, Take 1 tablet by mouth daily., Disp: 3 Package, Rfl: 4 .  cyclobenzaprine (FLEXERIL) 5 MG tablet, Take 1 tablet (5 mg total) by mouth 3 (three) times daily as needed for muscle spasms., Disp: 30 tablet, Rfl: 0 .  sertraline (ZOLOFT) 50 MG tablet, Take 1 tablet (50 mg total) by mouth daily., Disp: 30 tablet, Rfl: 1  No Known Allergies   ROS See HPI  Objective  Vitals:   01/14/18 1312  BP: 124/80  Pulse: 94  Resp: 16  Temp: 98.6 F (37 C)  TempSrc: Oral  SpO2: 96%  Weight: 159 lb 1.9 oz (72.2 kg)  Height: 5' 6.5" (1.689 m)   Body mass index is 25.3 kg/m.  Physical Exam Vital signs reviewed. Constitutional: Patient appears well-developed and well-nourished. No distress.  HENT: Head: Normocephalic and atraumatic. Nose: Nose normal. Mouth/Throat: Oropharynx is clear and moist. Eyes: Conjunctivae and EOM are normal. Pupils are equal, round, and reactive to light. No scleral icterus.  Neck: Normal range of motion. Neck supple.  No thyromegaly present.  Cardiovascular: Normal rate, regular rhythm and normal heart sounds.  No murmur heard. No BLE edema. Distal pulses intact. Subcutaneous telangiectasias and reticular veins to bilateral lower extremities. Pulmonary/Chest: Effort normal and breath sounds  normal. No respiratory distress. Musculoskeletal: Normal range of motion, no joint effusions. No gross deformities Neurological: She is alert and oriented to person, place, and time. No cranial nerve deficit. Coordination, balance, strength, speech and gait are normal.  Skin: Skin is warm and dry. No rash noted. No erythema.  Psychiatric: Patient appears anxious. Affect is normal. Judgment and thought content normal.  PHQ2/9: Depression screen [facility] 2/9 01/14/2018 01/14/2018  Decreased Interest 3 0  Down, Depressed, Hopeless 3 0  PHQ - 2 Score 6 0  Altered sleeping 1 -  Tired, decreased energy 2 -  Change in appetite 0 -  Feeling bad or failure about yourself  1 -  Trouble concentrating 2 -  Moving slowly or fidgety/restless 0 -  Suicidal  thoughts 0 -  PHQ-9 Score 12 -    Fall Risk: Fall Risk  01/14/2018  Falls in the past year? No    Assessment & Plan RTC in 1 month for F/U of anxiety and depression-starting zoloft; headaches; neck pain  Nonintractable headache, unspecified chronicity pattern, unspecified headache type ?migraines We discussed that her anxiety and muscle spasm could be triggering migraine- will begin treatment for these problems today and see if migraines improve RTC in 1 month for follow up. Discussed sooner follow up if needed Will consider migraine treatment, referral to neuro for continued migraines. - cyclobenzaprine (FLEXERIL) 5 MG tablet; Take 1 tablet (5 mg total) by mouth 3 (three) times daily as needed for muscle spasms.  Dispense: 30 tablet; Refill: 0  Pain of lower extremity, unspecified laterality We discussed referral to vein and vascular for further evaluation of leg pain and varicose veins - Ambulatory referral to Vascular Surgery  Neck pain - cyclobenzaprine (FLEXERIL) 5 MG tablet; Take 1 tablet (5 mg total) by mouth 3 (three) times daily as needed for muscle spasms.  Dispense: 30 tablet; Refill: 0

## 2018-01-14 NOTE — Patient Instructions (Addendum)
I have sent a prescription for zoloft 50mg  tablets to your pharmacy. Please start 1/2 tablet once daily for 1 week and then increase to a full tablet once daily on week two as tolerated.  Some side effects such as nausea, drowsiness and weight gain can occur.  Also rarely people have experienced suicidal thoughts when taking this medication.  Please discontinue the medication and go directly to ED if this occurs.  Id like to see you back in about 1 month to evaluate progress.    I have sent a prescription for flexeril 5mg  tablets for your neck pain. You can take this up to 3 times a day for your neck pain. This medication can make you drowsy. Do not operate machinery or drink alcohol when you take this medication.  I have placed a referral to counseling and vascular . Our office will call you to schedule this appointment. You should hear from our office in 7-10 days.  Please return in about 1 month so we can see how you are doing on the zoloft.  It was good to meet you. Thanks for letting me take care of you today :)  Mindfulness-Based Stress Reduction Mindfulness-based stress reduction (MBSR) is a program that helps people learn to practice mindfulness. Mindfulness is the practice of intentionally paying attention to the present moment. It can be learned and practiced through techniques such as education, breathing exercises, meditation, and yoga. MBSR includes several mindfulness techniques in one program. MBSR works best when you understand the treatment, are willing to try new things, and can commit to spending time practicing what you learn. MBSR training may include learning about:  How your emotions, thoughts, and reactions affect your body.  New ways to respond to things that cause negative thoughts to start (triggers).  How to notice your thoughts and let go of them.  Practicing awareness of everyday things that you normally do without thinking.  The techniques and goals of different  types of meditation.  What are the benefits of MBSR? MBSR can have many benefits, which include helping you to:  Develop self-awareness. This refers to knowing and understanding yourself.  Learn skills and attitudes that help you to participate in your own health care.  Learn new ways to care for yourself.  Be more accepting about how things are, and let things go.  Be less judgmental and approach things with an open mind.  Be patient with yourself and trust yourself more.  MBSR has also been shown to:  Reduce negative emotions, such as depression and anxiety.  Improve memory and focus.  Change how you sense and approach pain.  Boost your body's ability to fight infections.  Help you connect better with other people.  Improve your sense of well-being.  Follow these instructions at home:  Find a local in-person or online MBSR program.  Set aside some time regularly for mindfulness practice.  Find a mindfulness practice that works best for you. This may include one or more of the following: ? Meditation. Meditation involves focusing your mind on a certain thought or activity. ? Breathing awareness exercises. These help you to stay present by focusing on your breath. ? Body scan. For this practice, you lie down and pay attention to each part of your body from head to toe. You can identify tension and soreness and intentionally relax parts of your body. ? Yoga. Yoga involves stretching and breathing, and it can improve your ability to move and be flexible. It can  also provide an experience of testing your body's limits, which can help you release stress. ? Mindful eating. This way of eating involves focusing on the taste, texture, color, and smell of each bite of food. Because this slows down eating and helps you feel full sooner, it can be an important part of a weight-loss plan.  Find a podcast or recording that provides guidance for breathing awareness, body scan, or  meditation exercises. You can listen to these any time when you have a free moment to rest without distractions.  Follow your treatment plan as told by your health care provider. This may include taking regular medicines and making changes to your diet or lifestyle as recommended. How to practice mindfulness To do a basic awareness exercise:  Find a comfortable place to sit.  Pay attention to the present moment. Observe your thoughts, feelings, and surroundings just as they are.  Avoid placing judgment on yourself, your feelings, or your surroundings. Make note of any judgment that comes up, and let it go.  Your mind may wander, and that is okay. Make note of when your thoughts drift, and return your attention to the present moment.  To do basic mindfulness meditation:  Find a comfortable place to sit. This may include a stable chair or a firm floor cushion. ? Sit upright with your back straight. Let your arms fall next to your side with your hands resting on your legs. ? If sitting in a chair, rest your feet flat on the floor. ? If sitting on a cushion, cross your legs in front of you.  Keep your head in a neutral position with your chin dropped slightly. Relax your jaw and rest the tip of your tongue on the roof of your mouth. Drop your gaze to the floor. You can close your eyes if you like.  Breathe normally and pay attention to your breath. Feel the air moving in and out of your nose. Feel your belly expanding and relaxing with each breath.  Your mind may wander, and that is okay. Make note of when your thoughts drift, and return your attention to your breath.  Avoid placing judgment on yourself, your feelings, or your surroundings. Make note of any judgment or feelings that come up, let them go, and bring your attention back to your breath.  When you are ready, lift your gaze or open your eyes. Pay attention to how your body feels after the meditation.  Where to find more  information: You can find more information about MBSR from:  Your health care provider.  Community-based meditation centers or programs.  Programs offered near you.  Summary  Mindfulness-based stress reduction (MBSR) is a program that teaches you how to intentionally pay attention to the present moment. It is used with other treatments to help you cope better with daily stress, emotions, and pain.  MBSR focuses on developing self-awareness, which allows you to respond to life stress without judgment or negative emotions.  MBSR programs may involve learning different mindfulness practices, such as breathing exercises, meditation, yoga, body scan, or mindful eating. Find a mindfulness practice that works best for you, and set aside time for it on a regular basis. This information is not intended to replace advice given to you by your health care provider. Make sure you discuss any questions you have with your health care provider. Document Released: 02/25/2017 Document Revised: 02/25/2017 Document Reviewed: 02/25/2017 Elsevier Interactive Patient Education  [employer].

## 2018-01-18 ENCOUNTER — Ambulatory Visit (INDEPENDENT_AMBULATORY_CARE_PROVIDER_SITE_OTHER): Payer: BLUE CROSS/BLUE SHIELD | Admitting: Vascular Surgery

## 2018-01-18 ENCOUNTER — Encounter: Payer: Self-pay | Admitting: Vascular Surgery

## 2018-01-18 VITALS — BP 125/87 | HR 95 | Temp 98.8°F | Resp 18 | Ht 67.0 in | Wt 159.0 lb

## 2018-01-18 DIAGNOSIS — I781 Nevus, non-neoplastic: Secondary | ICD-10-CM

## 2018-01-18 DIAGNOSIS — I83893 Varicose veins of bilateral lower extremities with other complications: Secondary | ICD-10-CM | POA: Insufficient documentation

## 2018-01-18 NOTE — Progress Notes (Signed)
Subjective:     Patient ID: Amber Ware, female   DOB: 1980/12/06, 37 y.o.   MRN: [id number]  HPI This 37 year old female was referred for evaluation of bilateral varicose veins.  She has had no previous treatment.  Referred by Brien Few.  She has no history of DVT thrombophlebitis stasis ulcers or bleeding.  She has noted a prominent vein particularly in the left lateral calf recently.  She does have some aching discomfort in both legs below the knees.  She also has "restless legs" at night.  She has tried short-leg elastic compression stockings but has had no improvement.  She has not noted any bulging varicosities.  Past Medical History:  Diagnosis Date  . Abnormal Pap smear of cervix 2014  . Polycystic ovarian syndrome     Social History   Tobacco Use  . Smoking status: Former Research scientist (life sciences)  . Smokeless tobacco: Never Used  Substance Use Topics  . Alcohol use: No    Frequency: Never    Family History  Problem Relation Age of Onset  . Breast cancer Mother 49       still alive  . Thyroid disease Mother   . Hypertension Father   . Breast cancer Maternal Aunt 86  . Breast cancer Maternal Aunt 55    No Known Allergies   Current Outpatient Medications:  .  cyclobenzaprine (FLEXERIL) 5 MG tablet, Take 1 tablet (5 mg total) by mouth 3 (three) times daily as needed for muscle spasms., Disp: 30 tablet, Rfl: 0 .  Norgestimate-Ethinyl Estradiol Triphasic (ORTHO TRI-CYCLEN LO) 0.18/0.215/0.25 MG-25 MCG tab, Take 1 tablet by mouth daily., Disp: 3 Package, Rfl: 4 .  sertraline (ZOLOFT) 50 MG tablet, Take 1 tablet (50 mg total) by mouth daily., Disp: 30 tablet, Rfl: 1  Vitals:   01/18/18 1306  BP: 125/87  Pulse: 95  Resp: 18  Temp: 98.8 F (37.1 C)  TempSrc: Oral  SpO2: 99%  Weight: 159 lb (72.1 kg)  Height: 5\' 7"  (1.702 m)    Body mass index is 24.9 kg/m.         Review of Systems Denies chest pain, dyspnea on exertion, PND, orthopnea, hemoptysis,  claudication.    Objective:   Physical Exam BP 125/87 (BP Location: Left Arm, Patient Position: Sitting, Cuff Size: Normal)   Pulse 95   Temp 98.8 F (37.1 C) (Oral)   Resp 18   Ht 5\' 7"  (1.702 m)   Wt 159 lb (72.1 kg)   SpO2 99%   BMI 24.90 kg/m     Gen.-alert and oriented x3 in no apparent distress HEENT normal for age Lungs no rhonchi or wheezing Cardiovascular regular rhythm no murmurs carotid pulses 3+ palpable no bruits audible Abdomen soft nontender no palpable masses Musculoskeletal free of  major deformities Skin clear -no rashes Neurologic normal Lower extremities 3+ femoral and dorsalis pedis pulses palpable bilaterally with no edema Isolated spider vein in the left medial and lateral calf.  No bulging varicosities, hyperpigmentation, ulceration, or edema noted Right leg is essentially free of spider veins or varicosities  Today I performed a bedside sono site ultrasound exam which revealed normal-sized great saphenous veins bilaterally with no evidence of reflux      Assessment:     Spider veins left lower extremity-mild-asymptomatic-no intervention or treatment indicated    Plan:     The spider veins enlarge or become more prominent patient will be back in touch with [place] to consider foam sclerotherapy but no treatment  will be performed at this time Return on a as needed basis

## 2018-02-09 ENCOUNTER — Ambulatory Visit: Payer: BLUE CROSS/BLUE SHIELD | Admitting: Clinical

## 2018-02-09 DIAGNOSIS — F331 Major depressive disorder, recurrent, moderate: Secondary | ICD-10-CM

## 2018-02-24 ENCOUNTER — Ambulatory Visit: Payer: BLUE CROSS/BLUE SHIELD | Admitting: Nurse Practitioner

## 2018-03-09 ENCOUNTER — Ambulatory Visit (INDEPENDENT_AMBULATORY_CARE_PROVIDER_SITE_OTHER): Payer: BLUE CROSS/BLUE SHIELD | Admitting: Clinical

## 2018-03-09 DIAGNOSIS — F331 Major depressive disorder, recurrent, moderate: Secondary | ICD-10-CM | POA: Diagnosis not present

## 2018-03-23 ENCOUNTER — Ambulatory Visit (INDEPENDENT_AMBULATORY_CARE_PROVIDER_SITE_OTHER): Payer: BLUE CROSS/BLUE SHIELD | Admitting: Clinical

## 2018-03-23 DIAGNOSIS — F332 Major depressive disorder, recurrent severe without psychotic features: Secondary | ICD-10-CM

## 2018-03-30 ENCOUNTER — Ambulatory Visit (INDEPENDENT_AMBULATORY_CARE_PROVIDER_SITE_OTHER): Payer: BLUE CROSS/BLUE SHIELD | Admitting: Clinical

## 2018-03-30 DIAGNOSIS — F331 Major depressive disorder, recurrent, moderate: Secondary | ICD-10-CM

## 2018-04-26 ENCOUNTER — Ambulatory Visit: Payer: BLUE CROSS/BLUE SHIELD | Admitting: Nurse Practitioner

## 2018-04-26 ENCOUNTER — Encounter

## 2018-05-11 ENCOUNTER — Ambulatory Visit (INDEPENDENT_AMBULATORY_CARE_PROVIDER_SITE_OTHER): Payer: BLUE CROSS/BLUE SHIELD | Admitting: Clinical

## 2018-05-11 DIAGNOSIS — F331 Major depressive disorder, recurrent, moderate: Secondary | ICD-10-CM

## 2018-05-25 ENCOUNTER — Ambulatory Visit (INDEPENDENT_AMBULATORY_CARE_PROVIDER_SITE_OTHER): Payer: BLUE CROSS/BLUE SHIELD | Admitting: Clinical

## 2018-05-25 DIAGNOSIS — F331 Major depressive disorder, recurrent, moderate: Secondary | ICD-10-CM | POA: Diagnosis not present

## 2018-06-06 ENCOUNTER — Ambulatory Visit: Payer: BLUE CROSS/BLUE SHIELD | Admitting: Clinical

## 2018-06-09 DIAGNOSIS — M9901 Segmental and somatic dysfunction of cervical region: Secondary | ICD-10-CM | POA: Diagnosis not present

## 2018-06-09 DIAGNOSIS — M9904 Segmental and somatic dysfunction of sacral region: Secondary | ICD-10-CM | POA: Diagnosis not present

## 2018-06-09 DIAGNOSIS — M9902 Segmental and somatic dysfunction of thoracic region: Secondary | ICD-10-CM | POA: Diagnosis not present

## 2018-06-09 DIAGNOSIS — M9903 Segmental and somatic dysfunction of lumbar region: Secondary | ICD-10-CM | POA: Diagnosis not present

## 2018-06-13 DIAGNOSIS — M9902 Segmental and somatic dysfunction of thoracic region: Secondary | ICD-10-CM | POA: Diagnosis not present

## 2018-06-13 DIAGNOSIS — M9901 Segmental and somatic dysfunction of cervical region: Secondary | ICD-10-CM | POA: Diagnosis not present

## 2018-06-13 DIAGNOSIS — M9903 Segmental and somatic dysfunction of lumbar region: Secondary | ICD-10-CM | POA: Diagnosis not present

## 2018-06-13 DIAGNOSIS — M9904 Segmental and somatic dysfunction of sacral region: Secondary | ICD-10-CM | POA: Diagnosis not present

## 2018-06-16 ENCOUNTER — Ambulatory Visit (INDEPENDENT_AMBULATORY_CARE_PROVIDER_SITE_OTHER): Payer: BLUE CROSS/BLUE SHIELD | Admitting: Clinical

## 2018-06-16 DIAGNOSIS — F331 Major depressive disorder, recurrent, moderate: Secondary | ICD-10-CM | POA: Diagnosis not present

## 2018-06-16 DIAGNOSIS — M9901 Segmental and somatic dysfunction of cervical region: Secondary | ICD-10-CM | POA: Diagnosis not present

## 2018-06-16 DIAGNOSIS — M9902 Segmental and somatic dysfunction of thoracic region: Secondary | ICD-10-CM | POA: Diagnosis not present

## 2018-06-16 DIAGNOSIS — M9903 Segmental and somatic dysfunction of lumbar region: Secondary | ICD-10-CM | POA: Diagnosis not present

## 2018-06-16 DIAGNOSIS — M9904 Segmental and somatic dysfunction of sacral region: Secondary | ICD-10-CM | POA: Diagnosis not present

## 2018-06-23 DIAGNOSIS — M9901 Segmental and somatic dysfunction of cervical region: Secondary | ICD-10-CM | POA: Diagnosis not present

## 2018-06-23 DIAGNOSIS — M9904 Segmental and somatic dysfunction of sacral region: Secondary | ICD-10-CM | POA: Diagnosis not present

## 2018-06-29 DIAGNOSIS — M9903 Segmental and somatic dysfunction of lumbar region: Secondary | ICD-10-CM | POA: Diagnosis not present

## 2018-06-29 DIAGNOSIS — M9901 Segmental and somatic dysfunction of cervical region: Secondary | ICD-10-CM | POA: Diagnosis not present

## 2018-06-29 DIAGNOSIS — M9904 Segmental and somatic dysfunction of sacral region: Secondary | ICD-10-CM | POA: Diagnosis not present

## 2018-06-29 DIAGNOSIS — M9902 Segmental and somatic dysfunction of thoracic region: Secondary | ICD-10-CM | POA: Diagnosis not present

## 2018-07-13 ENCOUNTER — Telehealth: Payer: Self-pay | Admitting: Nurse Practitioner

## 2018-07-13 NOTE — Telephone Encounter (Signed)
ok 

## 2018-07-13 NOTE — Telephone Encounter (Signed)
Copied from [place] [id number]. Topic: Appointment Scheduling - Scheduling Inquiry for Clinic >> Jul 13, 2018  3:53 PM Jarold Motto, Fraser Din wrote: Reason for CRM: Patient called and stated that she would like to transfer bach to [place]. Please advise Cb#[phone]

## 2018-07-13 NOTE — Telephone Encounter (Signed)
Patient is requesting to transfer care back to [facility]  Amber Ware, is this okay with you? Amber Ware, is this okay with you?

## 2018-07-13 NOTE — Telephone Encounter (Signed)
Okay with me 

## 2018-07-14 NOTE — Telephone Encounter (Signed)
Can you help to schedule this patient with Baldo Ash at [place]? Thank you!

## 2018-07-14 NOTE — Telephone Encounter (Signed)
Can you help schedule ?

## 2018-07-15 DIAGNOSIS — M9902 Segmental and somatic dysfunction of thoracic region: Secondary | ICD-10-CM | POA: Diagnosis not present

## 2018-07-15 DIAGNOSIS — M9903 Segmental and somatic dysfunction of lumbar region: Secondary | ICD-10-CM | POA: Diagnosis not present

## 2018-07-15 DIAGNOSIS — M9901 Segmental and somatic dysfunction of cervical region: Secondary | ICD-10-CM | POA: Diagnosis not present

## 2018-07-15 DIAGNOSIS — M9904 Segmental and somatic dysfunction of sacral region: Secondary | ICD-10-CM | POA: Diagnosis not present

## 2018-07-15 NOTE — Telephone Encounter (Signed)
Called and left voicemail for patient to give [place] a call back to schedule apt.

## 2018-07-19 ENCOUNTER — Encounter: Payer: Self-pay | Admitting: Nurse Practitioner

## 2018-07-21 ENCOUNTER — Encounter: Payer: Self-pay | Admitting: Nurse Practitioner

## 2018-07-21 ENCOUNTER — Ambulatory Visit: Payer: BLUE CROSS/BLUE SHIELD | Admitting: Nurse Practitioner

## 2018-07-21 VITALS — BP 126/86 | HR 80 | Temp 98.2°F | Ht 67.0 in | Wt 161.8 lb

## 2018-07-21 DIAGNOSIS — F32A Depression, unspecified: Secondary | ICD-10-CM

## 2018-07-21 DIAGNOSIS — M791 Myalgia, unspecified site: Secondary | ICD-10-CM | POA: Diagnosis not present

## 2018-07-21 DIAGNOSIS — F329 Major depressive disorder, single episode, unspecified: Secondary | ICD-10-CM

## 2018-07-21 DIAGNOSIS — F419 Anxiety disorder, unspecified: Secondary | ICD-10-CM

## 2018-07-21 DIAGNOSIS — M2550 Pain in unspecified joint: Secondary | ICD-10-CM | POA: Diagnosis not present

## 2018-07-21 LAB — SEDIMENTATION RATE: SED RATE: 21 mm/h — AB (ref 0–20)

## 2018-07-21 LAB — C-REACTIVE PROTEIN: CRP: 0.4 mg/dL — ABNORMAL LOW (ref 0.5–20.0)

## 2018-07-21 NOTE — Progress Notes (Signed)
Subjective:  Patient ID: Amber Ware, female    DOB: May 31, 1981  Age: 37 y.o. MRN: [id number]  CC: Establish Care (transfer from [facility], patient is complainning of legs pain,tingling,weakness,joints pain all the time. this has been going on for 2 years but it is getting worse. pt also metion headache.)  Leg Pain   Incident onset: onset 49yrs ago. There was no injury mechanism. The pain is present in the left hip, left thigh, left leg, right hip, right thigh and right leg. The quality of the pain is described as aching and cramping. The pain is severe. The pain has been fluctuating since onset. Associated symptoms include muscle weakness and tingling. Pertinent negatives include no inability to bear weight, loss of motion or numbness. Associated symptoms comments: Tightness and heaviness. Exacerbated by: after activity. She has tried NSAIDs and rest (chiropractor) for the symptoms. The treatment provided mild relief.   multiple joint, muscle pain and headache. Improves while active, worse after activity, then improves again with rest. She does not want to take any medication at this time.  Unable to tolerate zoloft, but not interested in taking anything else for mood. Ongoing counseling sessions with psychology.  Reviewed past Medical, Social and Family history today.  Outpatient Medications Prior to Visit  Medication Sig Dispense Refill  . Norgestimate-Ethinyl Estradiol Triphasic (ORTHO TRI-CYCLEN LO) 0.18/0.215/0.25 MG-25 MCG tab Take 1 tablet by mouth daily. 3 Package 4  . cyclobenzaprine (FLEXERIL) 5 MG tablet Take 1 tablet (5 mg total) by mouth 3 (three) times daily as needed for muscle spasms. (Patient not taking: Reported on 07/21/2018) 30 tablet 0  . sertraline (ZOLOFT) 50 MG tablet Take 1 tablet (50 mg total) by mouth daily. (Patient not taking: Reported on 07/21/2018) 30 tablet 1   No facility-administered medications prior to visit.    ROS See HPI  Objective:  BP 126/86    Pulse 80   Temp 98.2 F (36.8 C) (Oral)   Ht 5\' 7"  (1.702 m)   Wt 161 lb 12.8 oz (73.4 kg)   LMP 07/10/2018   SpO2 98%   BMI 25.34 kg/m   BP Readings from Last 3 Encounters:  07/21/18 126/86  01/18/18 125/87  01/14/18 124/80    Wt Readings from Last 3 Encounters:  07/21/18 161 lb 12.8 oz (73.4 kg)  01/18/18 159 lb (72.1 kg)  01/14/18 159 lb 1.9 oz (72.2 kg)    Physical Exam  Constitutional: She is oriented to person, place, and time. She appears well-developed and well-nourished.  Neck: Normal range of motion. Neck supple.  Cardiovascular: Intact distal pulses.  Musculoskeletal: Normal range of motion. She exhibits no edema, tenderness or deformity.       Right hip: Normal.       Left hip: Normal.       Right knee: Normal.       Left knee: Normal.       Right ankle: Normal.       Left ankle: Normal.       Lumbar back: Normal.       Right upper leg: Normal.       Left upper leg: Normal.       Right lower leg: Normal.       Left lower leg: Normal.       Right foot: Normal.       Left foot: Normal.  Neurological: She is alert and oriented to person, place, and time.  Psychiatric: Her speech is normal and behavior is  normal. Her mood appears anxious. Cognition and memory are normal.  Vitals reviewed.   Lab Results  Component Value Date   WBC 5.4 11/09/2016   HGB 12.7 11/09/2016   HCT 36.3 11/09/2016   PLT 316.0 11/09/2016   GLUCOSE 82 11/09/2016   ALT 13 11/09/2016   AST 17 11/09/2016   NA 141 11/09/2016   K 4.8 11/09/2016   CL 105 11/09/2016   CREATININE 0.73 11/09/2016   BUN 17 11/09/2016   CO2 29 11/09/2016   TSH 1.41 11/09/2016   HGBA1C 5.2 11/09/2016    US Transvaginal Non-ob  Result Date: 11/10/2016 CLINICAL DATA:  Irregular menses with excessive hair growth EXAM: TRANSABDOMINAL AND TRANSVAGINAL ULTRASOUND OF PELVIS TECHNIQUE: Both transabdominal and transvaginal ultrasound examinations of the pelvis were performed. Transabdominal technique was  performed for global imaging of the pelvis including uterus, ovaries, adnexal regions, and pelvic cul-de-sac. It was necessary to proceed with endovaginal exam following the transabdominal exam to visualize the endometrium and ovaries. COMPARISON:  None FINDINGS: Uterus Measurements: 7.8 x 2.8 x 4 cm. No fibroids or other mass visualized. Endometrium Thickness: 6.7 mm. Possible echogenic debris versus small polyp within the endometrial canal. Right ovary Measurements: 3.9 x 2.1 x 2.3 cm. No mass. Peripheral orientation of ovarian follicles. Left ovary Measurements: 4.1 x 2.5 x 3.2 cm. No mass. Peripheral orientation of follicles Other findings No abnormal free fluid. IMPRESSION: 1. Peripheral distribution of ovarian follicles, can be seen in the setting of polycystic ovaries. 2. Possible echogenic debris within the endometrial canal versus endometrial polyp. Correlation with sonohysterogram could be obtained for further evaluation. Electronically Signed   By: Donavan Foil M.D.   On: 11/10/2016 15:54   US Pelvis Complete  Result Date: 11/10/2016 CLINICAL DATA:  Irregular menses with excessive hair growth EXAM: TRANSABDOMINAL AND TRANSVAGINAL ULTRASOUND OF PELVIS TECHNIQUE: Both transabdominal and transvaginal ultrasound examinations of the pelvis were performed. Transabdominal technique was performed for global imaging of the pelvis including uterus, ovaries, adnexal regions, and pelvic cul-de-sac. It was necessary to proceed with endovaginal exam following the transabdominal exam to visualize the endometrium and ovaries. COMPARISON:  None FINDINGS: Uterus Measurements: 7.8 x 2.8 x 4 cm. No fibroids or other mass visualized. Endometrium Thickness: 6.7 mm. Possible echogenic debris versus small polyp within the endometrial canal. Right ovary Measurements: 3.9 x 2.1 x 2.3 cm. No mass. Peripheral orientation of ovarian follicles. Left ovary Measurements: 4.1 x 2.5 x 3.2 cm. No mass. Peripheral orientation of  follicles Other findings No abnormal free fluid. IMPRESSION: 1. Peripheral distribution of ovarian follicles, can be seen in the setting of polycystic ovaries. 2. Possible echogenic debris within the endometrial canal versus endometrial polyp. Correlation with sonohysterogram could be obtained for further evaluation. Electronically Signed   By: Donavan Foil M.D.   On: 11/10/2016 15:54    Assessment & Plan:   Jocelynn was seen today for establish care.  Diagnoses and all orders for this visit:  Anxiety and depression  Myalgia -     Sedimentation rate -     Antinuclear Antib (ANA) -     C-reactive protein -     Vitamin D 1,25 dihydroxy -     Ambulatory referral to Physical Therapy  Arthralgia, unspecified joint -     Sedimentation rate -     Antinuclear Antib (ANA) -     C-reactive protein -     Vitamin D 1,25 dihydroxy -     Ambulatory referral to Physical Therapy  I have discontinued Reyana N. Bemis's sertraline and cyclobenzaprine. I am also having her maintain her Norgestimate-Ethinyl Estradiol Triphasic.  No orders of the defined types were placed in this encounter.   Follow-up: No follow-ups on file.  Wilfred Lacy, NP

## 2018-07-21 NOTE — Patient Instructions (Addendum)
  You will be called to schedule appt for physical therapy.  Encourage regular exercise like: walking, stationery bike or water aerobic.  Continue counseling sessions with psychologist.  You may use tylenol 500mg  or ibuprofen or naproxen for pain as directed on package.

## 2018-07-25 LAB — ANA: ANA: NEGATIVE

## 2018-07-25 LAB — VITAMIN D 1,25 DIHYDROXY
VITAMIN D 1, 25 (OH) TOTAL: 63 pg/mL (ref 18–72)
Vitamin D2 1, 25 (OH)2: <8 pg/mL
Vitamin D3 1, 25 (OH)2: 63 pg/mL

## 2018-07-27 DIAGNOSIS — M9901 Segmental and somatic dysfunction of cervical region: Secondary | ICD-10-CM | POA: Diagnosis not present

## 2018-07-27 DIAGNOSIS — M9904 Segmental and somatic dysfunction of sacral region: Secondary | ICD-10-CM | POA: Diagnosis not present

## 2018-07-27 DIAGNOSIS — M9902 Segmental and somatic dysfunction of thoracic region: Secondary | ICD-10-CM | POA: Diagnosis not present

## 2018-07-27 DIAGNOSIS — M9903 Segmental and somatic dysfunction of lumbar region: Secondary | ICD-10-CM | POA: Diagnosis not present

## 2018-08-02 DIAGNOSIS — M9902 Segmental and somatic dysfunction of thoracic region: Secondary | ICD-10-CM | POA: Diagnosis not present

## 2018-08-02 DIAGNOSIS — M9903 Segmental and somatic dysfunction of lumbar region: Secondary | ICD-10-CM | POA: Diagnosis not present

## 2018-08-02 DIAGNOSIS — M9904 Segmental and somatic dysfunction of sacral region: Secondary | ICD-10-CM | POA: Diagnosis not present

## 2018-08-02 DIAGNOSIS — M9901 Segmental and somatic dysfunction of cervical region: Secondary | ICD-10-CM | POA: Diagnosis not present

## 2018-08-08 ENCOUNTER — Encounter: Payer: Self-pay | Admitting: Physical Therapy

## 2018-08-08 ENCOUNTER — Ambulatory Visit: Payer: BLUE CROSS/BLUE SHIELD | Attending: Nurse Practitioner | Admitting: Physical Therapy

## 2018-08-08 ENCOUNTER — Ambulatory Visit: Payer: BLUE CROSS/BLUE SHIELD | Admitting: Clinical

## 2018-08-08 DIAGNOSIS — R252 Cramp and spasm: Secondary | ICD-10-CM | POA: Diagnosis not present

## 2018-08-08 DIAGNOSIS — M542 Cervicalgia: Secondary | ICD-10-CM

## 2018-08-08 DIAGNOSIS — M546 Pain in thoracic spine: Secondary | ICD-10-CM

## 2018-08-08 NOTE — Patient Instructions (Signed)

## 2018-08-08 NOTE — Therapy (Signed)
[place], [place], [address] Phone: [phone]   Fax:  [phone]  Physical Therapy Evaluation  Patient Details  Name: Amber Ware MRN: [id number] Date of Birth: 1980-11-10 Referring Provider (PT): Nche   Encounter Date: 08/08/2018  PT End of Session - 08/08/18 1601    Visit Number  1    Date for PT Re-Evaluation  10/08/18    PT Start Time  0569    PT Stop Time  1620    PT Time Calculation (min)  50 min    Activity Tolerance  Patient tolerated treatment well    Behavior During Therapy  [facility] for tasks assessed/performed       Past Medical History:  Diagnosis Date  . Abnormal Pap smear of cervix 2014  . Polycystic ovarian syndrome     Past Surgical History:  Procedure Laterality Date  . CERVICAL BIOPSY  W/ LOOP ELECTRODE EXCISION  2014  . COLPOSCOPY  2014    There were no vitals filed for this visit.   Subjective Assessment - 08/08/18 1520    Subjective  Patient reports that she has been having back and neck pain for about 1-2 years.  Reports that she has been seeing a chiropractor and gets very temporary relief.  No x-rays.  C/O right side HA over the past year    Limitations  Lifting;House hold activities    Patient Stated Goals  have less pain    Currently in Pain?  Yes    Pain Score  6     Pain Location  Neck    Pain Orientation  Right    Pain Descriptors / Indicators  Spasm;Tightness;Sharp    Pain Type  Acute pain    Pain Radiating Towards  denies down arm    Pain Onset  More than a month ago    Pain Frequency  Constant    Aggravating Factors   motions, work, morning is worse, pain up to 8/10    Pain Relieving Factors  heat, chiropractor helps a little bit but no carry over > 30 minutes    Effect of Pain on Daily Activities  just annoying and painful         [facility] PT Assessment - 08/08/18 0001      Assessment   Medical Diagnosis  neck pain    Referring Provider  (PT)  Nche    Onset Date/Surgical Date  07/09/18    Hand Dominance  Left      Precautions   Precautions  None      Balance Screen   Has the patient fallen in the past 6 months  No    Has the patient had a decrease in activity level because of a fear of falling?   No    Is the patient reluctant to leave their home because of a fear of falling?   No      Home Environment   Additional Comments  light housework      Prior Function   Level of Independence  Independent    Vocation  Full time employment    Vocation Requirements  mostly standing, repetetive motions    Leisure  weights, some crossfit 3x/week      Posture/Postural Control   Posture Comments  mild fwd head, rounded shoulders, mild scapular winging      ROM / Strength   AROM / PROM / Strength  AROM;Strength  AROM   Overall AROM Comments  Cervical ROM WNL's except extension and side bending were decreased 25% with c/o increased stiffness and pain, lumbar ROM WNL's, Shoulders WNL's      Strength   Overall Strength Comments  Right shoulder 4-/5, left 4/5      Palpation   Palpation comment  has significant knots and spasms in the upper trap, levator and rhomboid on the right, mild winging scapulae                Objective measurements completed on examination: See above findings.      [facility] Adult PT Treatment/Exercise - 08/08/18 0001      Modalities   Modalities  Electrical Stimulation;Moist Heat      Moist Heat Therapy   Number Minutes Moist Heat  15 Minutes    Moist Heat Location  Cervical;Shoulder      Electrical Stimulation   Electrical Stimulation Location  right upper trap and into cervical and rhomboid area    Electrical Stimulation Action  IFC    Electrical Stimulation Parameters  supine    Electrical Stimulation Goals  Pain       Trigger Point Dry Needling - 08/08/18 1558    Muscles Treated Upper Body  Oblique capitus;Levator scapulae    Upper Trapezius Response  Twitch reponse  elicited    Oblique Capitus Response  Palpable increased muscle length    Levator Scapulae Response  Palpable increased muscle length           PT Education - 08/08/18 1556    Education Details  cervical and scapular retraction, shoulder shrugs and upper trap/levator stretches    Person(s) Educated  Patient    Methods  Explanation;Demonstration;Handout    Comprehension  Verbalized understanding       PT Short Term Goals - 08/08/18 1617      PT SHORT TERM GOAL #1   Title  independent iwth initial HEP    Time  2    Period  Weeks    Status  New        PT Long Term Goals - 08/08/18 1617      PT LONG TERM GOAL #1   Title  understand proper posture and body mechanics    Time  8    Period  Weeks    Status  New      PT LONG TERM GOAL #2   Title  decrease pain 50%    Time  8    Period  Weeks    Status  New      PT LONG TERM GOAL #3   Title  decrease HA 50%    Time  8    Period  Weeks    Status  New      PT LONG TERM GOAL #4   Title  increase cervcical ROM to WNL's    Time  8    Period  Weeks    Status  New             Plan - 08/08/18 1614    Clinical Impression Statement  Patient reports that she has had some right neck and shoulder pain for a few years, reports worse over the past year with an increased HA, reports that HA's are normal for her but now has one most every day.  She has some limitation in cervical ROM, has spasms in the upper trap, rhomboid and cervical area, some trigger points in the upper trap and rhomboid  cause the HA type sxs, has rounded shoulders and fwd head    Clinical Presentation  Stable    Clinical Decision Making  Low    Rehab Potential  Good    PT Frequency  2x / week    PT Duration  8 weeks    PT Treatment/Interventions  ADLs/Self Care Home Management;Cryotherapy;Electrical Stimulation;Moist Heat;Traction;Ultrasound;Therapeutic exercise;Therapeutic activities;Patient/family education;Manual techniques;Dry needling    PT Next  Visit Plan  start the gym exercises for scapular stability, could try traction, combo, DN    Consulted and Agree with Plan of Care  Patient       Patient will benefit from skilled therapeutic intervention in order to improve the following deficits and impairments:  Decreased range of motion, Increased muscle spasms, Pain, Improper body mechanics, Postural dysfunction, Decreased strength  Visit Diagnosis: Cervicalgia - Plan: PT plan of care cert/re-cert  Cramp and spasm - Plan: PT plan of care cert/re-cert  Pain in thoracic spine - Plan: PT plan of care cert/re-cert     Problem List Patient Active Problem List   Diagnosis Date Noted  . Varicose veins of bilateral lower extremities with other complications 56/70/1410  . Spider veins of limb 01/18/2018  . Anxiety and depression 01/14/2018  . Decreased sex drive 30/13/1438  . Excessive hair growth 11/10/2016  . Irregular menstrual cycle 11/10/2016    Sumner Boast., PT 08/08/2018, 4:20 PM  [place] [address] [place], [place], [address] Phone: [phone]   Fax:  [phone]  Name: Amber Ware MRN: [id number] Date of Birth: 26-May-1981

## 2018-08-15 DIAGNOSIS — M9901 Segmental and somatic dysfunction of cervical region: Secondary | ICD-10-CM | POA: Diagnosis not present

## 2018-08-16 ENCOUNTER — Encounter: Payer: Self-pay | Admitting: Physical Therapy

## 2018-08-16 ENCOUNTER — Ambulatory Visit: Payer: BLUE CROSS/BLUE SHIELD | Admitting: Physical Therapy

## 2018-08-16 DIAGNOSIS — M542 Cervicalgia: Secondary | ICD-10-CM

## 2018-08-16 DIAGNOSIS — R252 Cramp and spasm: Secondary | ICD-10-CM

## 2018-08-16 DIAGNOSIS — M546 Pain in thoracic spine: Secondary | ICD-10-CM | POA: Diagnosis not present

## 2018-08-16 NOTE — Therapy (Signed)
[place], [place], [address] Phone: [phone]   Fax:  [phone]  Physical Therapy Treatment  Patient Details  Name: Amber Ware MRN: [id number] Date of Birth: 1981-06-24 Referring Provider (PT): Nche   Encounter Date: 08/16/2018  PT End of Session - 08/16/18 1607    Visit Number  2    Date for PT Re-Evaluation  10/08/18    PT Start Time  1950    PT Stop Time  1619    PT Time Calculation (min)  64 min    Activity Tolerance  Patient tolerated treatment well    Behavior During Therapy  [facility] for tasks assessed/performed       Past Medical History:  Diagnosis Date  . Abnormal Pap smear of cervix 2014  . Polycystic ovarian syndrome     Past Surgical History:  Procedure Laterality Date  . CERVICAL BIOPSY  W/ LOOP ELECTRODE EXCISION  2014  . COLPOSCOPY  2014    There were no vitals filed for this visit.  Subjective Assessment - 08/16/18 1520    Subjective  Pt reports that she is doing better, DN helped overall     Currently in Pain?  Yes    Pain Score  5     Pain Location  Shoulder    Pain Orientation  Right;Posterior                       [facility] Adult PT Treatment/Exercise - 08/16/18 0001      Exercises   Exercises  Lumbar      Lumbar Exercises: Aerobic   Nustep  L4 x6 min       Lumbar Exercises: Machines for Strengthening   Other Lumbar Machine Exercise  Rows & lats 2x10       Lumbar Exercises: Standing   Row  Theraband;20 reps;Both;Strengthening    Theraband Level (Row)  Level 2 (Red)    Shoulder Extension  Theraband;20 reps;Both;Strengthening    Other Standing Lumbar Exercises  Shrugs with levater stretch 5lb       Modalities   Modalities  Electrical Stimulation;Moist Heat      Moist Heat Therapy   Number Minutes Moist Heat  15 Minutes    Moist Heat Location  Cervical;Shoulder      Electrical Stimulation   Electrical Stimulation Location   right upper trap and into cervical and rhomboid area    Electrical Stimulation Action  IFC    Electrical Stimulation Parameters  supine    Electrical Stimulation Goals  Pain      Manual Therapy   Manual Therapy  Passive ROM;Manual Traction;Soft tissue mobilization    Soft tissue mobilization  Posterior cervical para spinales    Passive ROM  Cervical spine    Manual Traction  Cervical 4x20''               PT Short Term Goals - 08/08/18 1617      PT SHORT TERM GOAL #1   Title  independent iwth initial HEP    Time  2    Period  Weeks    Status  New        PT Long Term Goals - 08/08/18 1617      PT LONG TERM GOAL #1   Title  understand proper posture and body mechanics    Time  8    Period  Weeks    Status  New  PT LONG TERM GOAL #2   Title  decrease pain 50%    Time  8    Period  Weeks    Status  New      PT LONG TERM GOAL #3   Title  decrease HA 50%    Time  8    Period  Weeks    Status  New      PT LONG TERM GOAL #4   Title  increase cervcical ROM to WNL's    Time  8    Period  Weeks    Status  New            Plan - 08/16/18 1610    Clinical Impression Statement  Patient tolerated an initial progression to TE well. No issues reported with today's activities. She does have some noticeable crepitus with shoulder shrugs. Reports that he feels better overall. Full cervical PROM noted with MT.    Rehab Potential  Good    PT Frequency  2x / week    PT Duration  8 weeks    PT Treatment/Interventions  ADLs/Self Care Home Management;Cryotherapy;Electrical Stimulation;Moist Heat;Traction;Ultrasound;Therapeutic exercise;Therapeutic activities;Patient/family education;Manual techniques;Dry needling    PT Next Visit Plan  gym exercises for scapular stability, could try traction, combo, DN       Patient will benefit from skilled therapeutic intervention in order to improve the following deficits and impairments:  Decreased range of motion, Increased  muscle spasms, Pain, Improper body mechanics, Postural dysfunction, Decreased strength  Visit Diagnosis: Cervicalgia  Cramp and spasm  Pain in thoracic spine     Problem List Patient Active Problem List   Diagnosis Date Noted  . Varicose veins of bilateral lower extremities with other complications 82/95/6213  . Spider veins of limb 01/18/2018  . Anxiety and depression 01/14/2018  . Decreased sex drive 08/65/7846  . Excessive hair growth 11/10/2016  . Irregular menstrual cycle 11/10/2016    Scot Jun, PTA 08/16/2018, 4:13 PM  [place], [place], [address] Phone: [phone]   Fax:  [phone]  Name: BRELYN WOEHL MRN: [id number] Date of Birth: [birth year]

## 2018-08-30 ENCOUNTER — Ambulatory Visit: Payer: BLUE CROSS/BLUE SHIELD | Admitting: Clinical

## 2018-08-30 ENCOUNTER — Encounter: Payer: Self-pay | Admitting: Physical Therapy

## 2018-08-30 ENCOUNTER — Ambulatory Visit: Payer: BLUE CROSS/BLUE SHIELD | Admitting: Physical Therapy

## 2018-08-30 DIAGNOSIS — M546 Pain in thoracic spine: Secondary | ICD-10-CM

## 2018-08-30 DIAGNOSIS — M9902 Segmental and somatic dysfunction of thoracic region: Secondary | ICD-10-CM | POA: Diagnosis not present

## 2018-08-30 DIAGNOSIS — M542 Cervicalgia: Secondary | ICD-10-CM | POA: Diagnosis not present

## 2018-08-30 DIAGNOSIS — R252 Cramp and spasm: Secondary | ICD-10-CM | POA: Diagnosis not present

## 2018-08-30 DIAGNOSIS — M9901 Segmental and somatic dysfunction of cervical region: Secondary | ICD-10-CM | POA: Diagnosis not present

## 2018-08-30 NOTE — Therapy (Signed)
[place], [place], [address] Phone: [phone]   Fax:  [phone]  Physical Therapy Treatment  Patient Details  Name: Amber Ware MRN: [id number] Date of Birth: [birth year] Referring Provider (PT): Nche   Encounter Date: 08/30/2018  PT End of Session - 08/30/18 1728    Visit Number  3    Date for PT Re-Evaluation  10/08/18    PT Start Time  1645    PT Stop Time  1743    PT Time Calculation (min)  58 min    Activity Tolerance  Patient tolerated treatment well    Behavior During Therapy  [facility] for tasks assessed/performed       Past Medical History:  Diagnosis Date  . Abnormal Pap smear of cervix 2014  . Polycystic ovarian syndrome     Past Surgical History:  Procedure Laterality Date  . CERVICAL BIOPSY  W/ LOOP ELECTRODE EXCISION  2014  . COLPOSCOPY  2014    There were no vitals filed for this visit.  Subjective Assessment - 08/30/18 1649    Subjective  Patient reports that she is doing better, she is haivng less pain overall, still c/o tightness and spasms in the upper trap and into the rhomboid area, has questions about Crossfit, reports that some of the exercises are bothering her    Currently in Pain?  Yes    Pain Score  2     Pain Location  Shoulder    Pain Orientation  Right;Posterior    Aggravating Factors   stretching hurts, work bothers "me"    Pain Relieving Factors  the treatment helps                       [facility] Adult PT Treatment/Exercise - 08/30/18 0001      Self-Care   Self-Care  Other Self-Care Comments    Other Self-Care Comments   patient with questions about workouts with Crossfit, went over form and how to watch weight and not lose the form, went over reps and when to stop, went over some precautions and how people tende to do things wrong      Exercises   Exercises  Shoulder      Lumbar Exercises: Aerobic   Nustep  L4 x6 min       Lumbar Exercises: Machines for Strengthening   Other Lumbar Machine Exercise  green tband ER      Lumbar Exercises: Standing   Row  Theraband;20 reps;Both;Strengthening    Theraband Level (Row)  Level 3 (Green)    Shoulder Extension  Theraband;20 reps;Both;Strengthening    Theraband Level (Shoulder Extension)  Level 3 (Green)    Other Standing Lumbar Exercises  Shrugs with levater stretch 5lb       Shoulder Exercises: Seated   Other Seated Exercises  4# bent over row, 3# bent over extension and 3# reverse flies      Modalities   Modalities  Electrical Stimulation;Moist Heat      Moist Heat Therapy   Number Minutes Moist Heat  15 Minutes    Moist Heat Location  Cervical;Shoulder      Electrical Stimulation   Electrical Stimulation Location  bilateral rhomboids    Electrical Stimulation Action  IFC    Electrical Stimulation Parameters  supine    Electrical Stimulation Goals  Pain      Manual Therapy   Manual Therapy  Soft tissue mobilization  Soft tissue mobilization  upper traps and into the rhomboids       Trigger Point Dry Needling - 08/30/18 1712    Muscles Treated Upper Body  Rhomboids    Levator Scapulae Response  Twitch response elicited    Rhomboids Response  Twitch response elicited           PT Education - 08/30/18 1729    Education Details  scapualr stabilization and shoulder ER with green tband for HEP    Person(s) Educated  Patient    Methods  Explanation;Demonstration;Tactile cues;Verbal cues;Handout    Comprehension  Verbalized understanding;Returned demonstration;Tactile cues required       PT Short Term Goals - 08/30/18 1735      PT SHORT TERM GOAL #1   Title  independent iwth initial HEP    Status  Achieved        PT Long Term Goals - 08/30/18 1735      PT LONG TERM GOAL #1   Title  understand proper posture and body mechanics    Status  Partially Met      PT LONG TERM GOAL #2   Title  decrease pain 50%    Status  On-going       PT LONG TERM GOAL #3   Title  decrease HA 50%    Status  Partially Met      PT LONG TERM GOAL #4   Title  increase cervcical ROM to WNL's    Status  On-going            Plan - 08/30/18 1734    Clinical Impression Statement  Patient with less spasms and seeming to have less pain in the upper traps, she has some increased c/o pain in the rhomboids.  She has quesitons about crossfit that we went over to help her with this and not injure herself or make her worse    PT Next Visit Plan  gym exercises for scapular stability,     Consulted and Agree with Plan of Care  Patient       Patient will benefit from skilled therapeutic intervention in order to improve the following deficits and impairments:  Decreased range of motion, Increased muscle spasms, Pain, Improper body mechanics, Postural dysfunction, Decreased strength  Visit Diagnosis: Cervicalgia  Cramp and spasm  Pain in thoracic spine     Problem List Patient Active Problem List   Diagnosis Date Noted  . Varicose veins of bilateral lower extremities with other complications 40/08/2724  . Spider veins of limb 01/18/2018  . Anxiety and depression 01/14/2018  . Decreased sex drive 36/64/4034  . Excessive hair growth 11/10/2016  . Irregular menstrual cycle 11/10/2016    Sumner Boast., PT 08/30/2018, 5:36 PM  [place] [address] [address] [place], [place], [address] Phone: [phone]   Fax:  [phone]  Name: Amber Ware MRN: [id number] Date of Birth: January 12, 1981

## 2018-09-14 ENCOUNTER — Ambulatory Visit: Payer: BLUE CROSS/BLUE SHIELD | Attending: Nurse Practitioner | Admitting: Physical Therapy

## 2018-09-14 ENCOUNTER — Encounter: Payer: Self-pay | Admitting: Physical Therapy

## 2018-09-14 ENCOUNTER — Ambulatory Visit: Payer: BLUE CROSS/BLUE SHIELD | Admitting: Clinical

## 2018-09-14 DIAGNOSIS — M546 Pain in thoracic spine: Secondary | ICD-10-CM | POA: Diagnosis not present

## 2018-09-14 DIAGNOSIS — R252 Cramp and spasm: Secondary | ICD-10-CM

## 2018-09-14 DIAGNOSIS — M542 Cervicalgia: Secondary | ICD-10-CM | POA: Insufficient documentation

## 2018-09-14 DIAGNOSIS — M9901 Segmental and somatic dysfunction of cervical region: Secondary | ICD-10-CM | POA: Diagnosis not present

## 2018-09-14 DIAGNOSIS — M9902 Segmental and somatic dysfunction of thoracic region: Secondary | ICD-10-CM | POA: Diagnosis not present

## 2018-09-14 DIAGNOSIS — M9904 Segmental and somatic dysfunction of sacral region: Secondary | ICD-10-CM | POA: Diagnosis not present

## 2018-09-14 DIAGNOSIS — M9903 Segmental and somatic dysfunction of lumbar region: Secondary | ICD-10-CM | POA: Diagnosis not present

## 2018-09-14 NOTE — Therapy (Signed)
[place], [place], [address] Phone: [phone]   Fax:  [phone]  Physical Therapy Treatment  Patient Details  Name: Amber Ware MRN: [id number] Date of Birth: 09/05/1981 Referring Provider (PT): Nche   Encounter Date: 09/14/2018  PT End of Session - 09/14/18 1727    Visit Number  4    Date for PT Re-Evaluation  10/08/18    PT Start Time  1638    PT Stop Time  1739    PT Time Calculation (min)  61 min    Activity Tolerance  Patient tolerated treatment well    Behavior During Therapy  [facility] for tasks assessed/performed       Past Medical History:  Diagnosis Date  . Abnormal Pap smear of cervix 2014  . Polycystic ovarian syndrome     Past Surgical History:  Procedure Laterality Date  . CERVICAL BIOPSY  W/ LOOP ELECTRODE EXCISION  2014  . COLPOSCOPY  2014    There were no vitals filed for this visit.  Subjective Assessment - 09/14/18 1640    Subjective  Pateint reports that she is feeling better overall, she reports that she is still very sore    Currently in Pain?  Yes    Pain Score  2     Pain Location  Shoulder    Pain Orientation  Right    Pain Relieving Factors  I think that the needles help                       [facility] Adult PT Treatment/Exercise - 09/14/18 0001      Exercises   Exercises  Shoulder      Shoulder Exercises: Seated   Other Seated Exercises  4# bent over row, 3# bent over extension and 3# reverse flies      Shoulder Exercises: Prone   Other Prone Exercises  serratus push on elbows hold 6seconds x 10 reps      Shoulder Exercises: Standing   Other Standing Exercises  8# overhead carry      Shoulder Exercises: ROM/Strengthening   UBE (Upper Arm Bike)  level 5 x 5 minutes      Modalities   Modalities  Electrical Stimulation;Moist Heat      Moist Heat Therapy   Number Minutes Moist Heat  15 Minutes    Moist Heat Location   Cervical;Shoulder      Electrical Stimulation   Electrical Stimulation Location  bilateral rhomboids    Electrical Stimulation Action  IFC    Electrical Stimulation Parameters  supine    Electrical Stimulation Goals  Pain      Manual Therapy   Manual Therapy  Soft tissue mobilization    Soft tissue mobilization  upper traps and into the rhomboids       Trigger Point Dry Needling - 09/14/18 1726    Consent Given?  Yes    Upper Trapezius Response  Twitch reponse elicited    Levator Scapulae Response  Twitch response elicited             PT Short Term Goals - 08/30/18 1735      PT SHORT TERM GOAL #1   Title  independent iwth initial HEP    Status  Achieved        PT Long Term Goals - 09/14/18 1730      PT LONG TERM GOAL #1   Title  understand proper  posture and body mechanics    Status  Achieved      PT LONG TERM GOAL #2   Title  decrease pain 50%    Status  Partially Met      PT LONG TERM GOAL #3   Title  decrease HA 50%    Status  Partially Met      PT LONG TERM GOAL #4   Title  increase cervcical ROM to WNL's    Status  Achieved            Plan - 09/14/18 1728    Clinical Impression Statement  Patient doing better and reports that she likes her exercise class that she is going to, reports that she feels the original exercises that we gave her are very good, reports that she is still having right upper trap and rhomboid pain, she has some small knots and spasms in these areas    PT Next Visit Plan  continue to work on stability and see if she feels like she can do on her own    Consulted and Agree with Plan of Care  Patient       Patient will benefit from skilled therapeutic intervention in order to improve the following deficits and impairments:  Decreased range of motion, Increased muscle spasms, Pain, Improper body mechanics, Postural dysfunction, Decreased strength  Visit Diagnosis: Cervicalgia  Cramp and spasm  Pain in thoracic  spine     Problem List Patient Active Problem List   Diagnosis Date Noted  . Varicose veins of bilateral lower extremities with other complications 57/97/2820  . Spider veins of limb 01/18/2018  . Anxiety and depression 01/14/2018  . Decreased sex drive 60/15/6153  . Excessive hair growth 11/10/2016  . Irregular menstrual cycle 11/10/2016    Sumner Boast., PT 09/14/2018, 5:31 PM  [place], [place], [address] Phone: [phone]   Fax:  [phone]  Name: Amber Ware MRN: [id number] Date of Birth: May 27, 1981

## 2018-09-27 ENCOUNTER — Other Ambulatory Visit: Payer: Self-pay

## 2018-09-27 ENCOUNTER — Ambulatory Visit: Payer: BLUE CROSS/BLUE SHIELD | Admitting: Physical Therapy

## 2018-09-27 ENCOUNTER — Other Ambulatory Visit (HOSPITAL_COMMUNITY)
Admission: RE | Admit: 2018-09-27 | Discharge: 2018-09-27 | Disposition: A | Discharge status: Home / Self Care | Payer: BLUE CROSS/BLUE SHIELD | Source: Ambulatory Visit | Attending: Certified Nurse Midwife | Admitting: Certified Nurse Midwife

## 2018-09-27 ENCOUNTER — Encounter: Payer: Self-pay | Admitting: Certified Nurse Midwife

## 2018-09-27 ENCOUNTER — Ambulatory Visit (INDEPENDENT_AMBULATORY_CARE_PROVIDER_SITE_OTHER): Payer: BLUE CROSS/BLUE SHIELD | Admitting: Certified Nurse Midwife

## 2018-09-27 VITALS — BP 120/78 | HR 70 | Resp 16 | Ht 66.5 in | Wt 162.0 lb

## 2018-09-27 DIAGNOSIS — Z124 Encounter for screening for malignant neoplasm of cervix: Secondary | ICD-10-CM | POA: Insufficient documentation

## 2018-09-27 DIAGNOSIS — Z8742 Personal history of other diseases of the female genital tract: Secondary | ICD-10-CM

## 2018-09-27 DIAGNOSIS — Z803 Family history of malignant neoplasm of breast: Secondary | ICD-10-CM

## 2018-09-27 DIAGNOSIS — M9901 Segmental and somatic dysfunction of cervical region: Secondary | ICD-10-CM | POA: Diagnosis not present

## 2018-09-27 DIAGNOSIS — Z3041 Encounter for surveillance of contraceptive pills: Secondary | ICD-10-CM | POA: Diagnosis not present

## 2018-09-27 DIAGNOSIS — M9904 Segmental and somatic dysfunction of sacral region: Secondary | ICD-10-CM | POA: Diagnosis not present

## 2018-09-27 DIAGNOSIS — M9903 Segmental and somatic dysfunction of lumbar region: Secondary | ICD-10-CM | POA: Diagnosis not present

## 2018-09-27 DIAGNOSIS — Z01419 Encounter for gynecological examination (general) (routine) without abnormal findings: Secondary | ICD-10-CM | POA: Diagnosis not present

## 2018-09-27 DIAGNOSIS — M9902 Segmental and somatic dysfunction of thoracic region: Secondary | ICD-10-CM | POA: Diagnosis not present

## 2018-09-27 MED ORDER — NORGESTIM-ETH ESTRAD TRIPHASIC 0.18/0.215/0.25 MG-25 MCG PO TABS: 1.0000 | ORAL_TABLET | Freq: Every day | ORAL | 4 refills | Status: DC

## 2018-09-27 NOTE — Patient Instructions (Signed)

## 2018-09-27 NOTE — Progress Notes (Signed)
37 y.o. G0P0000 Married  Caucasian Fe here for annual exam. Contraception working well, no missed pills or periods in last year.  Sees FNP once yearly for labs if needed. Working out with [employer] and doing well. No health issues today. Took trip to [place] this year!  Patient's last menstrual period was 08/29/2018 (exact date).          Sexually active: Yes.    The current method of family planning is OCP (estrogen/progesterone).    Exercising: Yes.    crossfit/gym Smoker:  no  Review of Systems  Constitutional: Negative.   HENT: Negative.   Eyes: Negative.   Respiratory: Negative.   Cardiovascular: Negative.   Gastrointestinal: Negative.   Genitourinary: Negative.   Musculoskeletal: Negative.   Skin: Negative.   Neurological: Negative.   Endo/Heme/Allergies: Negative.   Psychiatric/Behavioral: Negative.     Health Maintenance: Pap:  09-22-16 neg HPV HR neg History of Abnormal Pap: LEEP MMG:  none Self Breast exams: no Colonoscopy:  none BMD:   none TDaP:  2013 Shingles: no Pneumonia: no Hep C and HIV: not done Labs: if needed   reports that she has quit smoking. She has never used smokeless tobacco. She reports that she does not drink alcohol or use drugs.  Past Medical History:  Diagnosis Date  . Abnormal Pap smear of cervix 2014  . Polycystic ovarian syndrome     Past Surgical History:  Procedure Laterality Date  . CERVICAL BIOPSY  W/ LOOP ELECTRODE EXCISION  2014  . COLPOSCOPY  2014    Current Outpatient Medications  Medication Sig Dispense Refill  . Norgestimate-Ethinyl Estradiol Triphasic (ORTHO TRI-CYCLEN LO) 0.18/0.215/0.25 MG-25 MCG tab Take 1 tablet by mouth daily. 3 Package 4   No current facility-administered medications for this visit.     Family History  Problem Relation Age of Onset  . Breast cancer Mother 76       still alive  . Thyroid disease Mother   . Hypertension Father   . Breast cancer Maternal Aunt 56  . Breast cancer Maternal  Aunt 55    ROS:  Pertinent items are noted in HPI.  Otherwise, a comprehensive ROS was negative.  Exam:   BP 120/78   Pulse 70   Resp 16   Ht 5' 6.5" (1.689 m)   Wt 162 lb (73.5 kg)   LMP 08/29/2018 (Exact Date)   BMI 25.76 kg/m  Height: 5' 6.5" (168.9 cm) Ht Readings from Last 3 Encounters:  09/27/18 5' 6.5" (1.689 m)  07/21/18 5\' 7"  (1.702 m)  01/18/18 5\' 7"  (1.702 m)    General appearance: alert, cooperative and appears stated age Head: Normocephalic, without obvious abnormality, atraumatic Neck: no adenopathy, supple, symmetrical, trachea midline and thyroid normal to inspection and palpation Lungs: clear to auscultation bilaterally Breasts: normal appearance, no masses or tenderness, No nipple retraction or dimpling, No nipple discharge or bleeding, No axillary or supraclavicular adenopathy Heart: regular rate and rhythm Abdomen: soft, non-tender; no masses,  no organomegaly Extremities: extremities normal, atraumatic, no cyanosis or edema Skin: Skin color, texture, turgor normal. No rashes or lesions Lymph nodes: Cervical, supraclavicular, and axillary nodes normal. No abnormal inguinal nodes palpated Neurologic: Grossly normal   Pelvic: External genitalia:  no lesions, normal female              Urethra:  normal appearing urethra with no masses, tenderness or lesions              Bartholin's and Skene's: normal  Vagina: normal appearing vagina with normal color and discharge, no lesions              Cervix: no cervical motion tenderness, no lesions and nulliparous appearance              Pap taken: Yes.   Bimanual Exam:  Uterus:  normal size, contour, position, consistency, mobility, non-tender and anteverted              Adnexa: normal adnexa and no mass, fullness, tenderness               Rectovaginal: Confirms               Anus:  normal sphincter tone, no lesions  Chaperone present: yes  A:  Well Woman with normal exam  Contraception OCP  desired  History LEEP for abnormal pap smears  Strong family history of breast cancer, mother(62), 2 MA(50,55)    P:   Reviewed health and wellness pertinent to exam  Risks/benefits/warning signs of OCP reviewed, desires continuance  Rx Orthotricyclen lo see order with instructions  Stressed SBE, and genetic screening option availability.  Pap smear: yes  counseled on breast self exam, feminine hygiene, use and side effects of OCP's return annually or prn  An After Visit Summary was printed and given to the patient.

## 2018-09-30 LAB — CYTOLOGY - PAP
Diagnosis: NEGATIVE
HPV: NOT DETECTED

## 2018-10-03 ENCOUNTER — Ambulatory Visit: Payer: BLUE CROSS/BLUE SHIELD

## 2018-10-13 ENCOUNTER — Ambulatory Visit: Payer: BLUE CROSS/BLUE SHIELD | Admitting: Certified Nurse Midwife

## 2018-10-18 DIAGNOSIS — M9903 Segmental and somatic dysfunction of lumbar region: Secondary | ICD-10-CM | POA: Diagnosis not present

## 2018-10-18 DIAGNOSIS — M9901 Segmental and somatic dysfunction of cervical region: Secondary | ICD-10-CM | POA: Diagnosis not present

## 2018-10-18 DIAGNOSIS — M9902 Segmental and somatic dysfunction of thoracic region: Secondary | ICD-10-CM | POA: Diagnosis not present

## 2018-10-18 DIAGNOSIS — M9904 Segmental and somatic dysfunction of sacral region: Secondary | ICD-10-CM | POA: Diagnosis not present

## 2019-01-11 DIAGNOSIS — M9904 Segmental and somatic dysfunction of sacral region: Secondary | ICD-10-CM | POA: Diagnosis not present

## 2019-01-11 DIAGNOSIS — M9903 Segmental and somatic dysfunction of lumbar region: Secondary | ICD-10-CM | POA: Diagnosis not present

## 2019-01-11 DIAGNOSIS — M9901 Segmental and somatic dysfunction of cervical region: Secondary | ICD-10-CM | POA: Diagnosis not present

## 2019-01-11 DIAGNOSIS — M9902 Segmental and somatic dysfunction of thoracic region: Secondary | ICD-10-CM | POA: Diagnosis not present

## 2019-02-15 ENCOUNTER — Telehealth: Payer: Self-pay | Admitting: Nurse Practitioner

## 2019-02-15 NOTE — Telephone Encounter (Signed)
I called patient and left message on voicemail per Wilfred Lacy request to call patient and schedule appointment for video follow up visit for anxiety/depression.

## 2019-08-15 DIAGNOSIS — M531 Cervicobrachial syndrome: Secondary | ICD-10-CM | POA: Diagnosis not present

## 2019-08-15 DIAGNOSIS — M9901 Segmental and somatic dysfunction of cervical region: Secondary | ICD-10-CM | POA: Diagnosis not present

## 2019-08-15 DIAGNOSIS — M9902 Segmental and somatic dysfunction of thoracic region: Secondary | ICD-10-CM | POA: Diagnosis not present

## 2019-08-15 DIAGNOSIS — M791 Myalgia, unspecified site: Secondary | ICD-10-CM | POA: Diagnosis not present

## 2019-08-17 ENCOUNTER — Other Ambulatory Visit: Payer: Self-pay

## 2019-08-18 ENCOUNTER — Ambulatory Visit (INDEPENDENT_AMBULATORY_CARE_PROVIDER_SITE_OTHER): Payer: BC Managed Care – PPO

## 2019-08-18 ENCOUNTER — Ambulatory Visit (INDEPENDENT_AMBULATORY_CARE_PROVIDER_SITE_OTHER): Payer: BC Managed Care – PPO | Admitting: Nurse Practitioner

## 2019-08-18 ENCOUNTER — Encounter: Payer: Self-pay | Admitting: Nurse Practitioner

## 2019-08-18 ENCOUNTER — Ambulatory Visit: Payer: BLUE CROSS/BLUE SHIELD | Admitting: Nurse Practitioner

## 2019-08-18 VITALS — BP 118/80 | HR 65 | Temp 98.0°F | Ht 66.5 in | Wt 148.8 lb

## 2019-08-18 DIAGNOSIS — M9901 Segmental and somatic dysfunction of cervical region: Secondary | ICD-10-CM | POA: Diagnosis not present

## 2019-08-18 DIAGNOSIS — M531 Cervicobrachial syndrome: Secondary | ICD-10-CM | POA: Diagnosis not present

## 2019-08-18 DIAGNOSIS — R0602 Shortness of breath: Secondary | ICD-10-CM

## 2019-08-18 DIAGNOSIS — M791 Myalgia, unspecified site: Secondary | ICD-10-CM | POA: Diagnosis not present

## 2019-08-18 DIAGNOSIS — R1905 Periumbilic swelling, mass or lump: Secondary | ICD-10-CM | POA: Diagnosis not present

## 2019-08-18 DIAGNOSIS — M9902 Segmental and somatic dysfunction of thoracic region: Secondary | ICD-10-CM | POA: Diagnosis not present

## 2019-08-18 LAB — CBC WITH DIFFERENTIAL/PLATELET
Basophils Absolute: 0 10*3/uL (ref 0.0–0.1)
Basophils Relative: 0.5 % (ref 0.0–3.0)
Eosinophils Absolute: 0.1 10*3/uL (ref 0.0–0.7)
Eosinophils Relative: 1.3 % (ref 0.0–5.0)
HCT: 38 % (ref 36.0–46.0)
Hemoglobin: 13 g/dL (ref 12.0–15.0)
Lymphocytes Relative: 30.8 % (ref 12.0–46.0)
Lymphs Abs: 1.7 10*3/uL (ref 0.7–4.0)
MCHC: 34.1 g/dL (ref 30.0–36.0)
MCV: 88.9 fl (ref 78.0–100.0)
Monocytes Absolute: 0.3 10*3/uL (ref 0.1–1.0)
Monocytes Relative: 4.8 % (ref 3.0–12.0)
Neutro Abs: 3.4 10*3/uL (ref 1.4–7.7)
Neutrophils Relative %: 62.6 % (ref 43.0–77.0)
Platelets: 336 10*3/uL (ref 150.0–400.0)
RBC: 4.27 Mil/uL (ref 3.87–5.11)
RDW: 12.9 % (ref 11.5–15.5)
WBC: 5.5 10*3/uL (ref 4.0–10.5)

## 2019-08-18 LAB — COMPREHENSIVE METABOLIC PANEL
ALT: 18 U/L (ref 0–35)
AST: 20 U/L (ref 0–37)
Albumin: 4.5 g/dL (ref 3.5–5.2)
Alkaline Phosphatase: 65 U/L (ref 39–117)
BUN: 19 mg/dL (ref 6–23)
CO2: 26 mEq/L (ref 19–32)
Calcium: 10.3 mg/dL (ref 8.4–10.5)
Chloride: 105 mEq/L (ref 96–112)
Creatinine, Ser: 0.87 mg/dL (ref 0.40–1.20)
GFR: 72.94 mL/min (ref >60.00)
Glucose, Bld: 86 mg/dL (ref 70–99)
Potassium: 4.6 mEq/L (ref 3.5–5.1)
Sodium: 138 mEq/L (ref 135–145)
Total Bilirubin: 0.6 mg/dL (ref 0.2–1.2)
Total Protein: 7.5 g/dL (ref 6.0–8.3)

## 2019-08-18 LAB — TSH: TSH: 0.87 u[IU]/mL (ref 0.35–4.50)

## 2019-08-18 NOTE — Progress Notes (Signed)
Subjective:  Patient ID: Amber Ware, female    DOB: August 03, 1981  Age: 38 y.o. MRN: [id number]  CC: Shortness of Breath (pt is c/o SOB--cant get full breath--going for a long time but it is gettin worse. )  Shortness of Breath This is a chronic problem. The current episode started more than 1 year ago. The problem has been gradually worsening. Associated symptoms include orthopnea and PND. Pertinent negatives include no abdominal pain, chest pain, claudication, fever, hemoptysis, leg pain, leg swelling, neck pain, rash, rhinorrhea, sore throat, sputum production, swollen glands, syncope, vomiting or wheezing. The symptoms are aggravated by exercise, lying flat and any activity. Risk factors include oral contraceptive. She has tried nothing for the symptoms. The treatment provided no relief. There is no history of asthma, CAD, chronic lung disease, PE, pneumonia or a recent surgery.  GI Problem Primary symptoms do not include fever, fatigue, abdominal pain, nausea, vomiting, diarrhea, melena, jaundice, myalgias or rash. Primary symptoms comment: ABD mas diagnosed 2016. seems to be increasing in size. Episode onset: 2016. The onset was gradual. The problem has been gradually worsening.  The illness does not include chills, anorexia, dysphagia, odynophagia, bloating, constipation, tenesmus, back pain or itching. Associated medical issues do not include inflammatory bowel disease, GERD, gallstones, liver disease, alcohol abuse, PUD, gastric bypass or irritable bowel syndrome.  she sates mass was initially identified in 2016 while she was in [place]. Biopsy was obtained which was benign per patient.  Reviewed past Medical, Social and Family history today.  Outpatient Medications Prior to Visit  Medication Sig Dispense Refill  . Amber Ware (ORTHO TRI-CYCLEN LO) 0.18/0.215/0.25 MG-25 MCG tab Take 1 tablet by mouth daily. 3 Package 4   No facility-administered medications  prior to visit.     ROS See HPI  Objective:  BP 118/80   Pulse 65   Temp 98 F (36.7 C) (Tympanic)   Ht 5' 6.5" (1.689 m)   Wt 148 lb 12.8 oz (67.5 kg)   SpO2 97%   BMI 23.66 kg/m   BP Readings from Last 3 Encounters:  08/18/19 118/80  09/27/18 120/78  07/21/18 126/86   Wt Readings from Last 3 Encounters:  08/18/19 148 lb 12.8 oz (67.5 kg)  09/27/18 162 lb (73.5 kg)  07/21/18 161 lb 12.8 oz (73.4 kg)   Physical Exam Vitals signs reviewed.  Constitutional:      General: She is not in acute distress. Neck:     Musculoskeletal: Normal range of motion and neck supple.  Cardiovascular:     Rate and Rhythm: Normal rate and regular rhythm.     Pulses: Normal pulses.     Heart sounds: Normal heart sounds.  Pulmonary:     Effort: Pulmonary effort is normal.     Breath sounds: Normal breath sounds.  Chest:     Chest wall: No mass, tenderness or crepitus.  Abdominal:     General: Abdomen is flat.     Palpations: There is mass. There is no fluid wave, hepatomegaly or pulsatile mass.     Tenderness: There is no abdominal tenderness. There is no guarding.    Lymphadenopathy:     Cervical: No cervical adenopathy.  Skin:    General: Skin is warm and dry.     Findings: No rash.  Neurological:     Mental Status: She is alert and oriented to person, place, and time.  Psychiatric:        Mood and Affect: Mood normal.  Behavior: Behavior normal.    Lab Results  Component Value Date   WBC 5.5 08/18/2019   HGB 13.0 08/18/2019   HCT 38.0 08/18/2019   PLT 336.0 08/18/2019   GLUCOSE 86 08/18/2019   ALT 18 08/18/2019   AST 20 08/18/2019   NA 138 08/18/2019   K 4.6 08/18/2019   CL 105 08/18/2019   CREATININE 0.87 08/18/2019   BUN 19 08/18/2019   CO2 26 08/18/2019   TSH 0.87 08/18/2019   HGBA1C 5.2 11/09/2016    Assessment & Plan:   Amber Ware was seen today for shortness of breath.  Diagnoses and all orders for this visit:  Shortness of breath -     DG  Chest 2 View -     CBC w/Diff -     TSH  Periumbilical mass -     US Abdomen Limited; Future -     Comprehensive metabolic panel   I am having Amber Ware maintain her Amber Ware.  No orders of the defined types were placed in this encounter.   Problem List Items Addressed This Visit    None    Visit Diagnoses    Shortness of breath    -  Primary   Relevant Orders   DG Chest 2 View (Completed)   CBC w/Diff (Completed)   TSH (Completed)   Periumbilical mass       Relevant Orders   US Abdomen Limited   Comprehensive metabolic panel (Completed)      Follow-up: Return if symptoms worsen or fail to improve.  Amber Lacy, NP

## 2019-08-18 NOTE — Patient Instructions (Addendum)
Normal TSh, cbc, and CMP. Normal CXR Pending ABD [place]  Please sign medical release to get records from [facility] in [place].

## 2019-08-20 ENCOUNTER — Encounter: Payer: Self-pay | Admitting: Nurse Practitioner

## 2019-08-22 DIAGNOSIS — M531 Cervicobrachial syndrome: Secondary | ICD-10-CM | POA: Diagnosis not present

## 2019-08-22 DIAGNOSIS — M791 Myalgia, unspecified site: Secondary | ICD-10-CM | POA: Diagnosis not present

## 2019-08-22 DIAGNOSIS — M9902 Segmental and somatic dysfunction of thoracic region: Secondary | ICD-10-CM | POA: Diagnosis not present

## 2019-08-22 DIAGNOSIS — M9901 Segmental and somatic dysfunction of cervical region: Secondary | ICD-10-CM | POA: Diagnosis not present

## 2019-08-23 ENCOUNTER — Encounter: Payer: Self-pay | Admitting: Nurse Practitioner

## 2019-08-23 ENCOUNTER — Ambulatory Visit (INDEPENDENT_AMBULATORY_CARE_PROVIDER_SITE_OTHER)
Admission: RE | Admit: 2019-08-23 | Discharge: 2019-08-23 | Disposition: A | Discharge status: Home / Self Care | Payer: BC Managed Care – PPO | Source: Ambulatory Visit | Attending: Nurse Practitioner | Admitting: Nurse Practitioner

## 2019-08-23 ENCOUNTER — Telehealth: Payer: Self-pay | Admitting: Nurse Practitioner

## 2019-08-23 ENCOUNTER — Other Ambulatory Visit: Payer: Self-pay | Admitting: Nurse Practitioner

## 2019-08-23 ENCOUNTER — Other Ambulatory Visit: Payer: Self-pay

## 2019-08-23 ENCOUNTER — Ambulatory Visit (HOSPITAL_BASED_OUTPATIENT_CLINIC_OR_DEPARTMENT_OTHER)
Admission: RE | Admit: 2019-08-23 | Discharge: 2019-08-23 | Disposition: A | Discharge status: Home / Self Care | Payer: BC Managed Care – PPO | Source: Ambulatory Visit | Attending: Nurse Practitioner | Admitting: Nurse Practitioner

## 2019-08-23 DIAGNOSIS — R1900 Intra-abdominal and pelvic swelling, mass and lump, unspecified site: Secondary | ICD-10-CM | POA: Diagnosis not present

## 2019-08-23 DIAGNOSIS — R1905 Periumbilic swelling, mass or lump: Secondary | ICD-10-CM | POA: Diagnosis not present

## 2019-08-23 MED ORDER — IOHEXOL 300 MG/ML  SOLN
100.0000 mL | Freq: Once | INTRAMUSCULAR | Status: AC | PRN
  Administered 2019-08-23: 100 mL via INTRAVENOUS

## 2019-08-23 NOTE — Telephone Encounter (Signed)
Diane from [facility] called with stats that she wanted to rely to Dr Lorayne Marek;, nurse only .   Plead advise  Call back number asap  [phone]

## 2019-08-23 NOTE — Telephone Encounter (Signed)
They just want to make sure we got the result of CT. Baldo Ash called the pt herself and wants to see the pt in the office tomorrow between 8-10 if possible. Waiting for the pt to call back.

## 2019-08-24 ENCOUNTER — Ambulatory Visit: Payer: BC Managed Care – PPO | Admitting: Nurse Practitioner

## 2019-08-24 ENCOUNTER — Telehealth: Payer: Self-pay | Admitting: Nurse Practitioner

## 2019-08-24 DIAGNOSIS — R160 Hepatomegaly, not elsewhere classified: Secondary | ICD-10-CM

## 2019-08-24 DIAGNOSIS — R1900 Intra-abdominal and pelvic swelling, mass and lump, unspecified site: Secondary | ICD-10-CM

## 2019-08-24 NOTE — Telephone Encounter (Signed)
Advised Amber Ware about CT ABD/Pelvis results and possible differential diagnosis. I also informed her about the need to schedule appt with general surgery for biopsy and appt with oncology. She verbalized understanding. She stated mass was initially found 24yrs ago while living in [place]. Mass was biopsied and pathology was benign per patient. She does not remember previous size of mass nor previous diagnosis.She states SOB has not changed since last OV. She verbalized understanding and did not have any additional questions.

## 2019-08-24 NOTE — Telephone Encounter (Signed)
Patient called back for Amber Ware can be reached at Ph#  [phone]

## 2019-08-24 NOTE — Telephone Encounter (Signed)
-----   Message from Flossie Buffy, NP sent at 08/24/2019 10:08 AM EDT -----  ----- Message ----- From: Flossie Buffy, NP Sent: 08/23/2019   4:23 PM EDT To: Shawnie Pons, LPN  Left voice message for her to return my call. She is to schedule F2F appt with me tomorrow morning to discuss CT results.

## 2019-08-24 NOTE — Telephone Encounter (Signed)
appt set for today at 10 with Nche

## 2019-08-25 ENCOUNTER — Telehealth: Payer: Self-pay | Admitting: Nurse Practitioner

## 2019-08-25 DIAGNOSIS — M531 Cervicobrachial syndrome: Secondary | ICD-10-CM | POA: Diagnosis not present

## 2019-08-25 DIAGNOSIS — M791 Myalgia, unspecified site: Secondary | ICD-10-CM | POA: Diagnosis not present

## 2019-08-25 DIAGNOSIS — M9902 Segmental and somatic dysfunction of thoracic region: Secondary | ICD-10-CM | POA: Diagnosis not present

## 2019-08-25 DIAGNOSIS — M9901 Segmental and somatic dysfunction of cervical region: Secondary | ICD-10-CM | POA: Diagnosis not present

## 2019-08-25 NOTE — Telephone Encounter (Signed)
Ms. Amber Ware has been cld and scheduled to see Cira Rue on 11/2 at 1:45pm. She's been made aware that she will se Dr. Burr Medico during the appt as well. I offered the pt to be seen on 10/29 with another provider, but she declined due to her job. She's been made aware to arrive 15 minutes early.

## 2019-08-28 ENCOUNTER — Telehealth: Payer: Self-pay

## 2019-08-28 NOTE — Telephone Encounter (Signed)
Called patient to introduce myself and the role of nurse navigator. Confirmed apt with Lacie NP/Dr. Burr Medico on 11/2 @ 1 :68 PM. I will mail contact information for tx team members to patient. She has my direct phone number for questions.

## 2019-08-29 DIAGNOSIS — M531 Cervicobrachial syndrome: Secondary | ICD-10-CM | POA: Diagnosis not present

## 2019-08-29 DIAGNOSIS — M791 Myalgia, unspecified site: Secondary | ICD-10-CM | POA: Diagnosis not present

## 2019-08-29 DIAGNOSIS — M9901 Segmental and somatic dysfunction of cervical region: Secondary | ICD-10-CM | POA: Diagnosis not present

## 2019-08-29 DIAGNOSIS — M9902 Segmental and somatic dysfunction of thoracic region: Secondary | ICD-10-CM | POA: Diagnosis not present

## 2019-09-01 DIAGNOSIS — M9902 Segmental and somatic dysfunction of thoracic region: Secondary | ICD-10-CM | POA: Diagnosis not present

## 2019-09-01 DIAGNOSIS — M9901 Segmental and somatic dysfunction of cervical region: Secondary | ICD-10-CM | POA: Diagnosis not present

## 2019-09-01 DIAGNOSIS — M791 Myalgia, unspecified site: Secondary | ICD-10-CM | POA: Diagnosis not present

## 2019-09-01 DIAGNOSIS — M531 Cervicobrachial syndrome: Secondary | ICD-10-CM | POA: Diagnosis not present

## 2019-09-04 ENCOUNTER — Inpatient Hospital Stay: Payer: BC Managed Care – PPO | Attending: Nurse Practitioner | Admitting: Nurse Practitioner

## 2019-09-04 ENCOUNTER — Other Ambulatory Visit (HOSPITAL_COMMUNITY): Payer: Self-pay | Admitting: General Surgery

## 2019-09-04 ENCOUNTER — Telehealth: Payer: Self-pay

## 2019-09-04 ENCOUNTER — Encounter: Payer: Self-pay | Admitting: Nurse Practitioner

## 2019-09-04 ENCOUNTER — Other Ambulatory Visit: Payer: Self-pay

## 2019-09-04 VITALS — BP 127/88 | HR 67 | Temp 98.0°F | Resp 17 | Ht 66.5 in | Wt 151.4 lb

## 2019-09-04 DIAGNOSIS — Z803 Family history of malignant neoplasm of breast: Secondary | ICD-10-CM | POA: Diagnosis not present

## 2019-09-04 DIAGNOSIS — R1900 Intra-abdominal and pelvic swelling, mass and lump, unspecified site: Secondary | ICD-10-CM

## 2019-09-04 DIAGNOSIS — R0600 Dyspnea, unspecified: Secondary | ICD-10-CM | POA: Diagnosis not present

## 2019-09-04 DIAGNOSIS — R160 Hepatomegaly, not elsewhere classified: Secondary | ICD-10-CM

## 2019-09-04 DIAGNOSIS — R1909 Other intra-abdominal and pelvic swelling, mass and lump: Secondary | ICD-10-CM | POA: Diagnosis not present

## 2019-09-04 NOTE — Telephone Encounter (Signed)
Phone call made to [facility] in regards to obtaining patient medical records including all scans and any other notes.  This nurse spoke with "Collier Salina" and got fax number to send medical release form ([phone]).  Information will be mailed to cancer center on disk.

## 2019-09-04 NOTE — Progress Notes (Addendum)
[facility]  [phone] Fax:([phone]) [place] Note   Patient Care Team: Nche, Charlene Brooke, NP as PCP - General (Internal Medicine) 09/04/2019  CHIEF COMPLAINTS/PURPOSE OF CONSULTATION:  Retroperitoneal mass, referred by Wilfred Lacy, NP PCP  HISTORY OF PRESENTING ILLNESS:  Amber Ware 38 y.o. female is here because of retroperitoneal mass. She has known about the mass for 6 years, initially biopsied in [place] and benign per patient. She was offered surgical removal but declined. She was seen by PCP on 08/18/19 for worsening dyspnea that started over 1 year ago. Chest xray on 08/18/19 was unremarkable. Due to palpable mass, an abdominal US on 10/21 showed 8.5 x 3.7 x 5.3 solid mass which contains calcifications and increased vascularity adjacent to the psoas muscle. She underwent CT AP which showed the soft tissue mass measuring 8.9 x 4.9 x 2.9 cm. Also noted numerous hypoechoic masses throughout the liver measuring up to 1.6 cm. The patient was seen by Dr. Stark Klein earlier today.   She has no significant past medical history. She is married, lives with spouse. She is G0P0, having regular monthly menses, on OCP. She works at [employer]. 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 She smoked cigarettes for 2-3 years but quit 15 years ago. She reports heavy alcohol use 15 years ago but also quit 2-3 years ago. Denies other drug use. Family history is positive for breast cancer in her mother diagnosed at age 27, and 2 maternal aunts with breast cancer, ages at diagnosis unknown. Denies family history of colon or GYN cancer. Patient has never had mammogram or colonoscopy. She had an abnormal PAP remotely but has had normal screenings in recent years.   Today she presents by herself. Denies decreased appetite, unintentional weight loss, or fatigue. Denies n/v/c/d or change in bowel habits. Denies bleeding. Denies fever, chills, cough, chest  pain, dyspnea, or signs of thrombosis.    MEDICAL HISTORY:  Past Medical History:  Diagnosis Date   Abnormal Pap smear of cervix 2014   Polycystic ovarian syndrome     SURGICAL HISTORY: Past Surgical History:  Procedure Laterality Date   CERVICAL BIOPSY  W/ LOOP ELECTRODE EXCISION  2014   COLPOSCOPY  2014    SOCIAL HISTORY: Social History   Socioeconomic History   Marital status: Married    Spouse name: Not on file   Number of children: Not on file   Years of education: Not on file   Highest education level: Not on file  Occupational History   Not on file  Social Needs   Financial resource strain: Not on file   Food insecurity    Worry: Not on file    Inability: Not on file   Transportation needs    Medical: Not on file    Non-medical: Not on file  Tobacco Use   Smoking status: Former Smoker   Smokeless tobacco: Never Used  Substance and Sexual Activity   Alcohol use: No    Frequency: Never   Drug use: No   Sexual activity: Yes    Partners: Male    Birth control/protection: Pill  Lifestyle   Physical activity    Days per week: Not on file    Minutes per session: Not on file   Stress: Not on file  Relationships   Social connections    Talks on phone: Not on file    Gets together: Not on file    Attends  religious service: Not on file    Active member of club or organization: Not on file    Attends meetings of clubs or organizations: Not on file    Relationship status: Not on file   Intimate partner violence    Fear of current or ex partner: Not on file    Emotionally abused: Not on file    Physically abused: Not on file    Forced sexual activity: Not on file  Other Topics Concern   Not on file  Social History Narrative   Not on file    FAMILY HISTORY: Family History  Problem Relation Age of Onset   Breast cancer Mother 39       still alive   Thyroid disease Mother    Hypertension Father    Breast cancer Maternal  Aunt 77   Breast cancer Maternal Aunt 10    ALLERGIES:  has No Known Allergies.  MEDICATIONS:  Current Outpatient Medications  Medication Sig Dispense Refill   Norgestimate-Ethinyl Estradiol Triphasic (ORTHO TRI-CYCLEN LO) 0.18/0.215/0.25 MG-25 MCG tab Take 1 tablet by mouth daily. 3 Package 4   No current facility-administered medications for this visit.     REVIEW OF SYSTEMS:   Constitutional: Denies fevers, chills, decreased appetite, weight loss, fatigue, or abnormal night sweats Eyes: Denies blurriness of vision, double vision or watery eyes Ears, nose, mouth, throat, and face: Denies mucositis or sore throat Respiratory: Denies cough, dyspnea or wheezes Cardiovascular: Denies palpitation, chest discomfort or lower extremity swelling Gastrointestinal:  Denies nausea, vomiting, constipation, diarrhea, heartburn or change in bowel habits Skin: Denies abnormal skin rashes Lymphatics: Denies new lymphadenopathy or easy bruising Neurological:Denies numbness, tingling or new weaknesses Behavioral/Psych: Mood is stable, no new changes  All other systems were reviewed with the patient and are negative.  PHYSICAL EXAMINATION: ECOG PERFORMANCE STATUS: 0 - Asymptomatic  Vitals:   09/04/19 1353  BP: 127/88  Pulse: 67  Resp: 17  Temp: 98 F (36.7 C)  SpO2: 100%   Filed Weights   09/04/19 1353  Weight: 151 lb 6.4 oz (68.7 kg)    GENERAL:alert, no distress and comfortable SKIN: no skin rash  EYES:  sclera clear NECK: without mass LUNGS: clear with normal breathing effort HEART: regular rate & rhythm, no lower extremity edema ABDOMEN:abdomen soft, non-tender and normal bowel sounds. Palpable mass in left abdomen  NEURO: alert & oriented x 3 with fluent speech, no focal motor/sensory deficits  LABORATORY DATA:  I have reviewed the data as listed CBC Latest Ref Rng & Units 08/18/2019 11/09/2016 09/22/2016  WBC 4.0 - 10.5 K/uL 5.5 5.4 -  Hemoglobin 12.0 - 15.0 g/dL 13.0  12.7 12.1  Hematocrit 36.0 - 46.0 % 38.0 36.3 -  Platelets 150.0 - 400.0 K/uL 336.0 316.0 -   CMP Latest Ref Rng & Units 08/18/2019 11/09/2016  Glucose 70 - 99 mg/dL 86 82  BUN 6 - 23 mg/dL 19 17  Creatinine 0.40 - 1.20 mg/dL 0.87 0.73  Sodium 135 - 145 mEq/L 138 141  Potassium 3.5 - 5.1 mEq/L 4.6 4.8  Chloride 96 - 112 mEq/L 105 105  CO2 19 - 32 mEq/L 26 29  Calcium 8.4 - 10.5 mg/dL 10.3 10.4  Total Protein 6.0 - 8.3 g/dL 7.5 7.7  Total Bilirubin 0.2 - 1.2 mg/dL 0.6 0.6  Alkaline Phos 39 - 117 U/L 65 63  AST 0 - 37 U/L 20 17  ALT 0 - 35 U/L 18 13    RADIOGRAPHIC STUDIES: I have personally  reviewed the radiological images as listed and agreed with the findings in the report. Dg Chest 2 View  Result Date: 08/18/2019 CLINICAL DATA:  Shortness of breath EXAM: CHEST - 2 VIEW COMPARISON:  None. FINDINGS: Lungs are clear. Heart size and pulmonary vascularity are normal. No adenopathy. No bone lesions. IMPRESSION: No edema or consolidation. Electronically Signed   By: Lowella Grip III M.D.   On: 08/18/2019 12:05   Ct Abdomen Pelvis W Contrast  Result Date: 08/23/2019 CLINICAL DATA:  Abdominal mass EXAM: CT ABDOMEN AND PELVIS WITH CONTRAST TECHNIQUE: Multidetector CT imaging of the abdomen and pelvis was performed using the standard protocol following bolus administration of intravenous contrast. CONTRAST:  134mL OMNIPAQUE IOHEXOL 300 MG/ML  SOLN COMPARISON:  Abdominal ultrasound performed the same day FINDINGS: Lower chest: No acute abnormality. Hepatobiliary: Numerous hypoechoic masses are seen throughout the liver measuring up to 1.6 cm in greatest dimension. There is also a cyst in the dome of the liver measuring 1.4 cm (series 2, image 13) no gallstones, gallbladder wall thickening, or biliary dilatation. Pancreas: Unremarkable. No pancreatic ductal dilatation or surrounding inflammatory changes. Spleen: Normal in size without focal abnormality. Adrenals/Urinary Tract: Adrenal glands  are unremarkable. Kidneys are normal, without renal calculi, focal lesion, or hydronephrosis. Bladder is unremarkable. Stomach/Bowel: Stomach is within normal limits. Appendix appears normal. No evidence of bowel wall thickening, distention, or inflammatory changes. Vascular/Lymphatic: No significant vascular findings are present. No enlarged pelvic lymph nodes. Reproductive: Uterus and bilateral adnexa are unremarkable. Other: A retroperitoneal soft tissue mass with internal calcifications along the left aspect of the abdominal aorta measuring 8.9 (series 6, image 60) x 4.9 x 2.9 cm (series 2, image 42) is likely located in the left pararenal space. There is no fat density within the mass. No abdominal wall hernia or abnormality. No abdominopelvic ascites. Musculoskeletal: No acute or significant osseous findings. IMPRESSION: 1. Retroperitoneal soft tissue mass with internal calcifications along the left aspect of the abdominal aorta, measuring 8.9 x 4.9 x 2.9 cm. No definite fat density is identified within the mass. Differential considerations include a soft tissue sarcoma, a teratoma without significant fat or cystic component, and extragastrointestinal stromal tumor. 2. Numerous hypoechoic masses throughout the liver, measuring up to 1.6 cm in greatest dimension are suspicious for metastases. These results will be called to the ordering clinician or representative by the Radiologist Assistant, and communication documented in the PACS or zVision Dashboard. Electronically Signed   By: Zerita Boers M.D.   On: 08/23/2019 15:08   US Abdomen Limited  Result Date: 08/23/2019 CLINICAL DATA:  Periumbilical mass EXAM: ULTRASOUND ABDOMEN/MID ABDOMINAL AND PERIUMBILICAL REGION COMPARISON:  None. FINDINGS: Longitudinal and transverse images were obtained of the periumbilical in mid abdominal regions. There is a solid mass which is mildly inhomogeneous in echotexture and contains calcifications anterior to the left  psoas muscle, slightly posterior to the left common iliac artery measuring 8.5 x 3.7 x 5.3 cm. This mass shows a slight degree of vascularity. This mass is well circumscribed. There is a hyperechoic mass slightly lateral to the umbilicus on the left measuring 0.4 x 0.3 x 0.4 cm, a probable small lipoma. No hernias are evident. IMPRESSION: 1. 8.5 x 3.7 x 5.3 cm solid mass which contains calcification and mild increased vascularity adjacent to the left psoas muscle. A neoplastic focus must be of concern. Correlation with CT of the abdomen and pelvis, ideally with oral and intravenous contrast, advised to further evaluate this lesion. 2. Subcentimeter probable lipoma in  the subcutaneous tissues slightly lateral to the umbilicus. These results will be called to the ordering clinician or representative by the Radiologist Assistant, and communication documented in the PACS or zVision Dashboard. Electronically Signed   By: Lowella Grip III M.D.   On: 08/23/2019 10:43    ASSESSMENT & PLAN: 38 year old female without significant past medical history referred by PCP for large RP mass and multiple liver masses   1. Retroperitoneal mass  -We reviewed her available medical record in detail. Recent CT shows large RP mass concerning for neoplasm and multiple liver masses  -She reports the RP mass was initially found 6 years ago at [facility] in [place] and is benign, I have requested the outside records which will be compiled onto a disc and mailed to [place]. -Her case was reviewed with IR Dr. Kathlene Cote, the RP mass is amenable to biopsy. He and Dr. Burr Medico feel the liver lesions are possibly cysts, they do not appear malignant on CT.  -Once the records are obtained and reviewed, will then decide whether to proceed with PET scan and biopsy.  -The patient was seen by Dr. Barry Dienes today, will discuss her case in tumor board this week or next.  -The mass is palpable in the left abdomen, not significantly tender, she does not need  pain medication.  -Recent labs reviewed -F/u pending review of outside records and tumor board discussion, open for now    PLAN: -Obtain outside records to determine if PET and biopsy are needed  -Tumor board discussion -F/u pending   No orders of the defined types were placed in this encounter.  All questions were answered. The patient knows to call the clinic with any problems, questions or concerns.    Alla Feeling, NP 09/04/2019   Addendum  I have seen the patient, examined her. I agree with the assessment and and plan and have edited the notes.   Amber Ware is a 38 yo lady who was referred by her primary care physician for evaluation of her large retroperitoneal mass in the liver lesions found on recent CT scan.  She presented to the to her primary care physician with mild dyspnea for 1 to 2 years, which does not impact her activities or exercise.  On exam, I can feel the abdominal mass in left upper abdomen next to midline with mild tenderness. Per pt, the RP mass was initially discovered incidentally 6 years ago, and she underwent biopsy which was benign.  The work-up was done at [facility], but she does not remember she had liver lesions at that time.  I have personally reviewed his CT scanning image with patient  In detail, with IR Dr. Kathlene Cote this morning.  Her liver lesions does not appear to be malignant on scan, possible cyst. Her RP mass is feasible for needle biopsy if needed. I reviewed her recent lab results also.   I will get her previous medical records from [facility], they will decide if we need get a PET scan (or CT chest and liver MRI if PET not approved)  and needle biopsy. She is agreeable with the plan.   Will discuss her case in our GI tumor board this week.    Truitt Merle MD  09/04/2019

## 2019-09-05 ENCOUNTER — Telehealth: Payer: Self-pay | Admitting: Nurse Practitioner

## 2019-09-05 DIAGNOSIS — M9901 Segmental and somatic dysfunction of cervical region: Secondary | ICD-10-CM | POA: Diagnosis not present

## 2019-09-05 DIAGNOSIS — M531 Cervicobrachial syndrome: Secondary | ICD-10-CM | POA: Diagnosis not present

## 2019-09-05 DIAGNOSIS — M9902 Segmental and somatic dysfunction of thoracic region: Secondary | ICD-10-CM | POA: Diagnosis not present

## 2019-09-05 DIAGNOSIS — M791 Myalgia, unspecified site: Secondary | ICD-10-CM | POA: Diagnosis not present

## 2019-09-05 NOTE — Telephone Encounter (Signed)
No los per 11/2.

## 2019-09-07 DIAGNOSIS — M531 Cervicobrachial syndrome: Secondary | ICD-10-CM | POA: Diagnosis not present

## 2019-09-07 DIAGNOSIS — M791 Myalgia, unspecified site: Secondary | ICD-10-CM | POA: Diagnosis not present

## 2019-09-07 DIAGNOSIS — M9901 Segmental and somatic dysfunction of cervical region: Secondary | ICD-10-CM | POA: Diagnosis not present

## 2019-09-07 DIAGNOSIS — M9902 Segmental and somatic dysfunction of thoracic region: Secondary | ICD-10-CM | POA: Diagnosis not present

## 2019-09-14 NOTE — Telephone Encounter (Signed)
My nurse,  Can you please check with HIM department on the status of this request? Have we received the disc with her records yet? Thanks, Regan Rakers

## 2019-09-22 ENCOUNTER — Telehealth: Payer: Self-pay

## 2019-09-22 NOTE — Telephone Encounter (Signed)
Faxed urgent request along with medical release form to [facility] in [place] requesting her medical records be faxed to [place] or released to Care Everywhere. Received confirmation fax went through. Also let my direct line for return call.

## 2019-10-04 ENCOUNTER — Other Ambulatory Visit: Payer: Self-pay

## 2019-10-04 ENCOUNTER — Ambulatory Visit (INDEPENDENT_AMBULATORY_CARE_PROVIDER_SITE_OTHER): Payer: BC Managed Care – PPO | Admitting: Certified Nurse Midwife

## 2019-10-04 ENCOUNTER — Encounter: Payer: Self-pay | Admitting: Certified Nurse Midwife

## 2019-10-04 VITALS — BP 114/70 | HR 70 | Temp 97.1°F | Resp 16 | Ht 66.5 in | Wt 148.0 lb

## 2019-10-04 DIAGNOSIS — Z3041 Encounter for surveillance of contraceptive pills: Secondary | ICD-10-CM

## 2019-10-04 DIAGNOSIS — Z01419 Encounter for gynecological examination (general) (routine) without abnormal findings: Secondary | ICD-10-CM | POA: Diagnosis not present

## 2019-10-04 MED ORDER — NORGESTIM-ETH ESTRAD TRIPHASIC 0.18/0.215/0.25 MG-25 MCG PO TABS: 1.0000 | ORAL_TABLET | Freq: Every day | ORAL | 4 refills | Status: DC

## 2019-10-04 NOTE — Patient Instructions (Signed)

## 2019-10-04 NOTE — Progress Notes (Signed)
38 y.o. G0P0000 Married  Caucasian Fe here for annual exam. Periods in the past 2 months have 21 days with 7 day duration, but this normal with 28 day cycle and less duration. 14 pound weight loss, intentional and feels better. New tattoo on right thigh. No warning signs with OCP and desires continuance. Sees NP Charlene Brooke for aex, prn.  No health issues today.  Patient's last menstrual period was 10/02/2019 (exact date).          Sexually active: Yes.    The current method of family planning is OCP (estrogen/progesterone).    Exercising: Yes.    crossfit Smoker:  no  Review of Systems  Constitutional: Negative.   HENT: Negative.   Eyes: Negative.   Respiratory: Negative.   Cardiovascular: Negative.   Gastrointestinal: Negative.   Genitourinary: Negative.   Musculoskeletal: Negative.   Skin: Negative.   Neurological: Negative.   Endo/Heme/Allergies: Negative.   Psychiatric/Behavioral: Negative.     Health Maintenance: Pap:  09-22-16 neg HPV HR neg, 09-27-18 neg HPV HR neg History of Abnormal Pap: LEEP (2014) MMG:  none Self Breast exams: yes Colonoscopy:  none BMD:   none TDaP:  2013 Shingles: no Pneumonia: no Hep C and HIV: not done Labs: yes   reports that she has quit smoking. She has never used smokeless tobacco. She reports that she does not drink alcohol or use drugs.  Past Medical History:  Diagnosis Date  . Abnormal Pap smear of cervix 2014  . Polycystic ovarian syndrome     Past Surgical History:  Procedure Laterality Date  . CERVICAL BIOPSY  W/ LOOP ELECTRODE EXCISION  2014  . COLPOSCOPY  2014    Current Outpatient Medications  Medication Sig Dispense Refill  . Norgestimate-Ethinyl Estradiol Triphasic (ORTHO TRI-CYCLEN LO) 0.18/0.215/0.25 MG-25 MCG tab Take 1 tablet by mouth daily. 3 Package 4   No current facility-administered medications for this visit.     Family History  Problem Relation Age of Onset  . Breast cancer Mother 57       still  alive  . Thyroid disease Mother   . Hypertension Father   . Breast cancer Maternal Aunt 77  . Breast cancer Maternal Aunt 55    ROS:  Pertinent items are noted in HPI.  Otherwise, a comprehensive ROS was negative.  Exam:   BP 114/70   Pulse 70   Temp (!) 97.1 F (36.2 C) (Skin)   Resp 16   Ht 5' 6.5" (1.689 m)   Wt 148 lb (67.1 kg)   LMP 10/02/2019 (Exact Date)   BMI 23.53 kg/m  Height: 5' 6.5" (168.9 cm) Ht Readings from Last 3 Encounters:  10/04/19 5' 6.5" (1.689 m)  09/04/19 5' 6.5" (1.689 m)  08/18/19 5' 6.5" (1.689 m)    General appearance: alert, cooperative and appears stated age Head: Normocephalic, without obvious abnormality, atraumatic Neck: no adenopathy, supple, symmetrical, trachea midline and thyroid normal to inspection and palpation Lungs: clear to auscultation bilaterally Breasts: normal appearance, no masses or tenderness, No nipple retraction or dimpling, No nipple discharge or bleeding, No axillary or supraclavicular adenopathy Heart: regular rate and rhythm Abdomen: soft, non-tender; no masses,  no organomegaly Extremities: extremities normal, atraumatic, no cyanosis or edema Skin: Skin color, texture, turgor normal. No rashes or lesions, numerous tattoos Lymph nodes: Cervical, supraclavicular, and axillary nodes normal. No abnormal inguinal nodes palpated Neurologic: Grossly normal   Pelvic: External genitalia:  no lesions  Urethra:  normal appearing urethra with no masses, tenderness or lesions              Bartholin's and Skene's: normal                 Vagina: normal appearing vagina with normal color and discharge, no lesions              Cervix: no cervical motion tenderness, no lesions and nulliparous appearance              Pap taken: No. Bimanual Exam:  Uterus:  normal size, contour, position, consistency, mobility, non-tender and anteverted              Adnexa: normal adnexa and no mass, fullness, tenderness                Rectovaginal: Confirms               Anus:  normal sphincter tone, no lesions  Chaperone present: yes  A:  Well Woman with normal exam  Contraception OCP working well, but slight cycle change for two months, now normal  History of PCOS no medications  Family history of Breast Cancer  P:   Reviewed health and wellness pertinent to exam  Risks/benefits/warning signs of OCP reviewed. If cycles change again needs to advise.   Rx Ortho-tricyclen see order with instructions Pap smear: no   counseled on breast self exam, mammography screening, feminine hygiene, use and side effects of OCP's, adequate intake of calcium and vitamin D, diet and exercise  return annually or prn  An After Visit Summary was printed and given to the patient.

## 2019-10-10 ENCOUNTER — Telehealth: Payer: Self-pay | Admitting: *Deleted

## 2019-10-10 NOTE — Telephone Encounter (Signed)
Called & requested via vm 01/18/14 path report @ [phone].  Asked for return call.

## 2019-10-11 ENCOUNTER — Inpatient Hospital Stay
Admission: RE | Admit: 2019-10-11 | Discharge: 2019-10-11 | Disposition: A | Discharge status: Home / Self Care | Payer: Self-pay | Source: Ambulatory Visit | Attending: Hematology | Admitting: Hematology

## 2019-10-11 ENCOUNTER — Other Ambulatory Visit (HOSPITAL_COMMUNITY): Payer: Self-pay | Admitting: Hematology

## 2019-10-11 ENCOUNTER — Telehealth: Payer: Self-pay

## 2019-10-11 ENCOUNTER — Other Ambulatory Visit: Payer: Self-pay

## 2019-10-11 DIAGNOSIS — C801 Malignant (primary) neoplasm, unspecified: Secondary | ICD-10-CM

## 2019-10-11 DIAGNOSIS — R1900 Intra-abdominal and pelvic swelling, mass and lump, unspecified site: Secondary | ICD-10-CM

## 2019-10-11 NOTE — Telephone Encounter (Signed)
Took CD from [place], [place] to radiology to be uploaded into our system.

## 2019-10-11 NOTE — Progress Notes (Signed)
Chart review.

## 2019-10-13 ENCOUNTER — Encounter: Payer: Self-pay | Admitting: *Deleted

## 2019-10-13 ENCOUNTER — Telehealth: Payer: Self-pay

## 2019-10-13 NOTE — Progress Notes (Signed)
  Oncology Nurse Navigator Documentation  Navigator Location: [facility] (10/13/19 1000)   )Navigator Encounter Type: Diagnostic Results (10/13/19 1000)    Called Pathology Dept and spoke to [facility] to see if she can assist in obtaining Pathology results from [place]. [place].                                                Time Spent with Patient: 15 (10/13/19 1000)

## 2019-10-13 NOTE — Telephone Encounter (Signed)
Left voice message with [facility] that we are needing pathology report on biopsy done 01/18/2014 faxed to [place].  My direct line and fax number were left on this voice message.

## 2019-10-16 ENCOUNTER — Telehealth: Payer: Self-pay | Admitting: Nurse Practitioner

## 2019-10-16 NOTE — Telephone Encounter (Signed)
Spoke with Amber Ware, she is looking for pathology report from 75 from [place]. We have not received record from [place] yet, advised Amber Ware that I will refax the record request and update her when I get the record.   Release faxed 10/16/2019--PN    Copied from [place] [id number]. Topic: General - Inquiry >> Oct 16, 2019  2:29 PM Amber Ware wrote: Reason for CRM: Amber Ware with Amber Ware called to see if the medical records requested from [place] for pt were received by [facility]. If so, she would like to know if Amber Ware can return her call so she may be able to retain a copy. They have tried several times to request records for pt from [place]. [place] with no result. Please advise. Cb#[phone]

## 2019-10-19 ENCOUNTER — Telehealth: Payer: Self-pay | Admitting: Nurse Practitioner

## 2019-10-19 NOTE — Telephone Encounter (Signed)
I called patient to speak about the GI tumor board discussion and recommendations for her. Left HIPPA compliant message on her private voicemail to call me back.  Cira Rue, NP  10/19/2019

## 2019-10-19 NOTE — Telephone Encounter (Signed)
Re faxed record release again today.

## 2019-10-31 ENCOUNTER — Telehealth: Payer: Self-pay | Admitting: Nurse Practitioner

## 2019-10-31 ENCOUNTER — Encounter: Payer: Self-pay | Admitting: Nurse Practitioner

## 2019-10-31 NOTE — Telephone Encounter (Signed)
I called again to reach patient, left voicemail to return my call. Will try to contact her via Mychart message regarding discussing recommendations for her case.  Cira Rue, NP  10/31/2019

## 2019-11-20 ENCOUNTER — Telehealth: Payer: Self-pay | Admitting: Nurse Practitioner

## 2019-11-20 NOTE — Telephone Encounter (Signed)
Tried to reach patient, called mobile and work phone. No answers. Left message on personal cell phone to call me back to discuss tumor board recommendations.  Cira Rue, NP  11/20/19

## 2020-01-19 ENCOUNTER — Encounter: Payer: Self-pay | Admitting: Certified Nurse Midwife

## 2020-02-06 ENCOUNTER — Telehealth: Payer: Self-pay | Admitting: Nurse Practitioner

## 2020-02-06 NOTE — Telephone Encounter (Signed)
I called patient again to check in and review tumor board recommendations from several months ago. No answer on mobile or work numbers. I left a message on her private mobile to call me back to discuss. Will try to set up phone f/u to discuss, then order MRI if she agrees. Will continue to try to reach patient.   Cira Rue, NP

## 2020-02-07 ENCOUNTER — Telehealth: Payer: Self-pay | Admitting: Nurse Practitioner

## 2020-02-07 NOTE — Telephone Encounter (Signed)
Scheduled appt per 4/6 sch message - unable to reach pt . - left message with appt date and time   

## 2020-02-26 ENCOUNTER — Telehealth: Payer: BC Managed Care – PPO | Admitting: Nurse Practitioner

## 2020-02-27 ENCOUNTER — Telehealth: Payer: Self-pay | Admitting: Nurse Practitioner

## 2020-02-27 NOTE — Telephone Encounter (Signed)
Late entry: I called patient's mobile and spouse contact # when she did not arrive to our scheduled Mychart virtual visit on 02/26/20. Left generic message to call back. I have been unable to reach this patient for 3 months, will continue to attempt to reach her.   Amber Rue, NP  02/27/20

## 2020-03-18 ENCOUNTER — Ambulatory Visit: Payer: BC Managed Care – PPO | Attending: Internal Medicine

## 2020-03-18 DIAGNOSIS — Z23 Encounter for immunization: Secondary | ICD-10-CM

## 2020-03-18 NOTE — Progress Notes (Signed)
   Covid-19 Vaccination Clinic  Name:  Amber Ware    MRN: [id number] DOB: 1980-12-08  03/18/2020  [name] was observed post Covid-19 immunization for 15 minutes without incident. She was provided with Vaccine Information Sheet and instruction to access the V-Safe system.   [name] was instructed to call 911 with any severe reactions post vaccine: Marland Kitchen Difficulty breathing  . Swelling of face and throat  . A fast heartbeat  . A bad rash all over body  . Dizziness and weakness   Immunizations Administered    Name Date Dose VIS Date Route   Pfizer COVID-19 Vaccine 03/18/2020 12:05 PM 0.3 mL 12/27/2018 Intramuscular   Manufacturer: [place]   Lot: [id number]   [place]: [id number]

## 2020-04-08 ENCOUNTER — Ambulatory Visit: Payer: BC Managed Care – PPO | Attending: Internal Medicine

## 2020-04-08 DIAGNOSIS — Z23 Encounter for immunization: Secondary | ICD-10-CM

## 2020-04-08 NOTE — Progress Notes (Signed)
   Covid-19 Vaccination Clinic  Name:  [name]    MRN: [id number] DOB: 1981/10/04  04/08/2020  [name] was observed post Covid-19 immunization for 15 minutes without incident. She was provided with Vaccine Information Sheet and instruction to access the V-Safe system.   [name] was instructed to call 911 with any severe reactions post vaccine: Marland Kitchen Difficulty breathing  . Swelling of face and throat  . A fast heartbeat  . A bad rash all over body  . Dizziness and weakness

## 2020-06-18 ENCOUNTER — Encounter: Payer: Self-pay | Admitting: Nurse Practitioner

## 2020-08-16 DIAGNOSIS — M9901 Segmental and somatic dysfunction of cervical region: Secondary | ICD-10-CM | POA: Diagnosis not present

## 2020-08-16 DIAGNOSIS — M9904 Segmental and somatic dysfunction of sacral region: Secondary | ICD-10-CM | POA: Diagnosis not present

## 2020-08-16 DIAGNOSIS — M9903 Segmental and somatic dysfunction of lumbar region: Secondary | ICD-10-CM | POA: Diagnosis not present

## 2020-08-16 DIAGNOSIS — M9902 Segmental and somatic dysfunction of thoracic region: Secondary | ICD-10-CM | POA: Diagnosis not present

## 2020-09-23 DIAGNOSIS — M791 Myalgia, unspecified site: Secondary | ICD-10-CM | POA: Diagnosis not present

## 2020-09-23 DIAGNOSIS — M9901 Segmental and somatic dysfunction of cervical region: Secondary | ICD-10-CM | POA: Diagnosis not present

## 2020-09-23 DIAGNOSIS — M9902 Segmental and somatic dysfunction of thoracic region: Secondary | ICD-10-CM | POA: Diagnosis not present

## 2020-09-23 DIAGNOSIS — M531 Cervicobrachial syndrome: Secondary | ICD-10-CM | POA: Diagnosis not present

## 2020-10-04 DIAGNOSIS — M531 Cervicobrachial syndrome: Secondary | ICD-10-CM | POA: Diagnosis not present

## 2020-10-04 DIAGNOSIS — M9901 Segmental and somatic dysfunction of cervical region: Secondary | ICD-10-CM | POA: Diagnosis not present

## 2020-10-04 DIAGNOSIS — M791 Myalgia, unspecified site: Secondary | ICD-10-CM | POA: Diagnosis not present

## 2020-10-04 DIAGNOSIS — M9902 Segmental and somatic dysfunction of thoracic region: Secondary | ICD-10-CM | POA: Diagnosis not present

## 2020-10-07 ENCOUNTER — Ambulatory Visit: Payer: BC Managed Care – PPO | Admitting: Certified Nurse Midwife

## 2020-10-16 ENCOUNTER — Other Ambulatory Visit: Payer: Self-pay | Admitting: *Deleted

## 2020-10-16 DIAGNOSIS — Z3041 Encounter for surveillance of contraceptive pills: Secondary | ICD-10-CM

## 2020-10-16 NOTE — Telephone Encounter (Signed)
Medication refill request: Tri-lo-marzia Last AEX:  10-04-2019 DL  Next AEX: 11-07-20 KD Last MMG (if hormonal medication request): n/a Refill authorized: Today, please advise.   Medication pended for #28, 0RF. Please refill if appropriate.

## 2020-10-17 MED ORDER — NORGESTIM-ETH ESTRAD TRIPHASIC 0.18/0.215/0.25 MG-25 MCG PO TABS: 1.0000 | ORAL_TABLET | Freq: Every day | ORAL | 0 refills | Status: DC

## 2020-10-22 ENCOUNTER — Telehealth: Payer: Self-pay

## 2020-10-22 NOTE — Telephone Encounter (Signed)
Patient is calling regarding annual exam being rescheduled. Patient stated that she is almost out of her birth control and is requesting one additional refill to get her to her annual exam appointment scheduled for 11/14/20. Patient confirmed [facility] location [facility] on [address].

## 2020-10-22 NOTE — Telephone Encounter (Signed)
Patient aware rx was sent 10-17-2020

## 2020-11-07 ENCOUNTER — Ambulatory Visit: Payer: BC Managed Care – PPO | Admitting: Nurse Practitioner

## 2020-11-12 NOTE — Progress Notes (Signed)
40 y.o. G0P0000 Married Other or two or more races female here for annual exam.    Periodically notices an increased discharge that it is a yellowish color, notices odor, notices after sex. Has been with a new partner for almost 1 year.Would like STD testing  She is happy with her birth control pill and would like a refill  Period Duration (Days): 4 Period Pattern: Regular Menstrual Flow: Moderate Menstrual Control: Maxi pad,Tampon  Patient's last menstrual period was 10/24/2020 (exact date).          Sexually active: Yes.    The current method of family planning is OCP (estrogen/progesterone).    Exercising: Yes.    cardio Smoker:  no  Health Maintenance: Pap:  09-27-18 neg HPV HR neg History of abnormal Pap:  LEEP (2014) MMG:  none Colonoscopy:  none BMD:   none TDaP:  2013 Gardasil:   Not done Covid-19: pfizer Hep C testing: not done Screening Labs: n/a   reports that she has quit smoking. She has never used smokeless tobacco. She reports that she does not drink alcohol and does not use drugs.  Past Medical History:  Diagnosis Date  . Abnormal Pap smear of cervix 2014  . Polycystic ovarian syndrome     Past Surgical History:  Procedure Laterality Date  . CERVICAL BIOPSY  W/ LOOP ELECTRODE EXCISION  2014  . COLPOSCOPY  2014    Current Outpatient Medications  Medication Sig Dispense Refill  . Norgestimate-Ethinyl Estradiol Triphasic (ORTHO TRI-CYCLEN LO) 0.18/0.215/0.25 MG-25 MCG tab Take 1 tablet by mouth daily. 28 tablet 0   No current facility-administered medications for this visit.    Family History  Problem Relation Age of Onset  . Breast cancer Mother 61       still alive  . Thyroid disease Mother   . Hypertension Father   . Breast cancer Maternal Aunt 7  . Breast cancer Maternal Aunt 43    Review of Systems  Exam:   BP 114/68   Pulse 68   Resp 16   Ht 5\' 7"  (1.702 m)   Wt 149 lb (67.6 kg)   LMP 10/24/2020 (Exact Date)   BMI 23.34 kg/m    Height: 5\' 7"  (170.2 cm)  General appearance: alert, cooperative and appears stated age, no acute distress Head: Normocephalic, without obvious abnormality Neck: no adenopathy, thyroid normal to inspection and palpation Lungs: clear to auscultation bilaterally Breasts: No axillary or supraclavicular adenopathy, Normal to palpation without dominant masses Heart: regular rate and rhythm Abdomen: soft, non-tender; no masses,  no organomegaly Extremities: extremities normal, no edema Skin: No rashes or lesions Lymph nodes: Cervical, supraclavicular, and axillary nodes normal. No abnormal inguinal nodes palpated Neurologic: Grossly normal   Pelvic: External genitalia:  no lesions              Urethra:  normal appearing urethra with no masses, tenderness or lesions              Bartholins and Skenes: normal                 Vagina: normal appearing vagina, appropriate for age,moderat amount yellowish appearing discharge, no lesions              Cervix: neg cervical motion tenderness, no visible lesions             Bimanual Exam:   Uterus:  normal size, contour, position, consistency, mobility, non-tender  Adnexa: no mass, fullness, tenderness                Joy, CMA Chaperone was present for exam.  A/P:   Well woman exam with routine gynecological exam - Plan: Hepatitis C Antibody  Screen for sexually transmitted diseases - Plan: HIV Antibody (routine testing w rflx), RPR, C. trachomatis/N. gonorrhoeae RNA  Vaginal discharge - Plan: WET PREP FOR [place], YEAST, CLUE  BV (bacterial vaginosis) - Plan: metroNIDAZOLE (FLAGYL) 500 MG tablet  Encounter for surveillance of contraceptive pills - Plan: Norgestimate-Ethinyl Estradiol Triphasic (ORTHO TRI-CYCLEN LO) 0.18/0.215/0.25 MG-25 MCG tab  Candidiasis of vagina - Plan: fluconazole (DIFLUCAN) 150 MG tablet   Pap : Hx LEEP 2014, cotesting due 10/2021  Mammogram: First mammogram due next year, strong [facility] Breast Ca Mother (age  23) and 2 Maternal Aunts (both in 47's)

## 2020-11-14 ENCOUNTER — Other Ambulatory Visit: Payer: Self-pay

## 2020-11-14 ENCOUNTER — Encounter: Payer: Self-pay | Admitting: Nurse Practitioner

## 2020-11-14 ENCOUNTER — Ambulatory Visit (INDEPENDENT_AMBULATORY_CARE_PROVIDER_SITE_OTHER): Payer: BC Managed Care – PPO | Admitting: Nurse Practitioner

## 2020-11-14 VITALS — BP 114/68 | HR 68 | Resp 16 | Ht 67.0 in | Wt 149.0 lb

## 2020-11-14 DIAGNOSIS — B3731 Acute candidiasis of vulva and vagina: Secondary | ICD-10-CM

## 2020-11-14 DIAGNOSIS — Z113 Encounter for screening for infections with a predominantly sexual mode of transmission: Secondary | ICD-10-CM

## 2020-11-14 DIAGNOSIS — N898 Other specified noninflammatory disorders of vagina: Secondary | ICD-10-CM

## 2020-11-14 DIAGNOSIS — Z01419 Encounter for gynecological examination (general) (routine) without abnormal findings: Secondary | ICD-10-CM | POA: Diagnosis not present

## 2020-11-14 DIAGNOSIS — B373 Candidiasis of vulva and vagina: Secondary | ICD-10-CM

## 2020-11-14 DIAGNOSIS — N760 Acute vaginitis: Secondary | ICD-10-CM

## 2020-11-14 DIAGNOSIS — Z3041 Encounter for surveillance of contraceptive pills: Secondary | ICD-10-CM

## 2020-11-14 DIAGNOSIS — B9689 Other specified bacterial agents as the cause of diseases classified elsewhere: Secondary | ICD-10-CM

## 2020-11-14 LAB — WET PREP FOR TRICH, YEAST, CLUE

## 2020-11-14 MED ORDER — FLUCONAZOLE 150 MG PO TABS: 150.0000 mg | ORAL_TABLET | Freq: Once | ORAL | 0 refills | Status: AC

## 2020-11-14 MED ORDER — NORGESTIM-ETH ESTRAD TRIPHASIC 0.18/0.215/0.25 MG-25 MCG PO TABS: 1.0000 | ORAL_TABLET | Freq: Every day | ORAL | 4 refills | Status: DC

## 2020-11-14 MED ORDER — METRONIDAZOLE 500 MG PO TABS: 500.0000 mg | ORAL_TABLET | Freq: Two times a day (BID) | ORAL | 0 refills | Status: DC

## 2020-11-14 NOTE — Patient Instructions (Addendum)
Health Maintenance, Female Adopting a healthy lifestyle and getting preventive care are important in promoting health and wellness. Ask your health care provider about:  The right schedule for you to have regular tests and exams.  Things you can do on your own to prevent diseases and keep yourself healthy. What should I know about diet, weight, and exercise? Eat a healthy diet  Eat a diet that includes plenty of vegetables, fruits, low-fat dairy products, and lean protein.  Do not eat a lot of foods that are high in solid fats, added sugars, or sodium.   Maintain a healthy weight Body mass index (BMI) is used to identify weight problems. It estimates body fat based on height and weight. Your health care provider can help determine your BMI and help you achieve or maintain a healthy weight. Get regular exercise Get regular exercise. This is one of the most important things you can do for your health. Most adults should:  Exercise for at least 150 minutes each week. The exercise should increase your heart rate and make you sweat (moderate-intensity exercise).  Do strengthening exercises at least twice a week. This is in addition to the moderate-intensity exercise.  Spend less time sitting. Even light physical activity can be beneficial. Watch cholesterol and blood lipids Have your blood tested for lipids and cholesterol at 40 years of age, then have this test every 5 years. Have your cholesterol levels checked more often if:  Your lipid or cholesterol levels are high.  You are older than 40 years of age.  You are at high risk for heart disease. What should I know about cancer screening? Depending on your health history and family history, you may need to have cancer screening at various ages. This may include screening for:  Breast cancer.  Cervical cancer.  Colorectal cancer.  Skin cancer.  Lung cancer. What should I know about heart disease, diabetes, and high blood  pressure? Blood pressure and heart disease  High blood pressure causes heart disease and increases the risk of stroke. This is more likely to develop in people who have high blood pressure readings, are of African descent, or are overweight.  Have your blood pressure checked: ? Every 3-5 years if you are 18-39 years of age. ? Every year if you are 40 years old or older. Diabetes Have regular diabetes screenings. This checks your fasting blood sugar level. Have the screening done:  Once every three years after age 40 if you are at a normal weight and have a low risk for diabetes.  More often and at a younger age if you are overweight or have a high risk for diabetes. What should I know about preventing infection? Hepatitis B If you have a higher risk for hepatitis B, you should be screened for this virus. Talk with your health care provider to find out if you are at risk for hepatitis B infection. Hepatitis C Testing is recommended for:  Everyone born from 1945 through 1965.  Anyone with known risk factors for hepatitis C. Sexually transmitted infections (STIs)  Get screened for STIs, including gonorrhea and chlamydia, if: ? You are sexually active and are younger than 40 years of age. ? You are older than 40 years of age and your health care provider tells you that you are at risk for this type of infection. ? Your sexual activity has changed since you were last screened, and you are at increased risk for chlamydia or gonorrhea. Ask your health care provider   if you are at risk.  Ask your health care provider about whether you are at high risk for HIV. Your health care provider may recommend a prescription medicine to help prevent HIV infection. If you choose to take medicine to prevent HIV, you should first get tested for HIV. You should then be tested every 3 months for as long as you are taking the medicine. Pregnancy  If you are about to stop having your period (premenopausal) and  you may become pregnant, seek counseling before you get pregnant.  Take 400 to 800 micrograms (mcg) of folic acid every day if you become pregnant.  Ask for birth control (contraception) if you want to prevent pregnancy. Osteoporosis and menopause Osteoporosis is a disease in which the bones lose minerals and strength with aging. This can result in bone fractures. If you are 65 years old or older, or if you are at risk for osteoporosis and fractures, ask your health care provider if you should:  Be screened for bone loss.  Take a calcium or vitamin D supplement to lower your risk of fractures.  Be given hormone replacement therapy (HRT) to treat symptoms of menopause. Follow these instructions at home: Lifestyle  Do not use any products that contain nicotine or tobacco, such as cigarettes, e-cigarettes, and chewing tobacco. If you need help quitting, ask your health care provider.  Do not use street drugs.  Do not share needles.  Ask your health care provider for help if you need support or information about quitting drugs. Alcohol use  Do not drink alcohol if: ? Your health care provider tells you not to drink. ? You are pregnant, may be pregnant, or are planning to become pregnant.  If you drink alcohol: ? Limit how much you use to 0-1 drink a day. ? Limit intake if you are breastfeeding.  Be aware of how much alcohol is in your drink. In the [place] one drink equals one 12 oz bottle of beer (355 mL), one 5 oz glass of wine (148 mL), or one 1 oz glass of hard liquor (44 mL). General instructions  Schedule regular health, dental, and eye exams.  Stay current with your vaccines.  Tell your health care provider if: ? You often feel depressed. ? You have ever been abused or do not feel safe at home. Summary  Adopting a healthy lifestyle and getting preventive care are important in promoting health and wellness.  Follow your health care provider's instructions about healthy  diet, exercising, and getting tested or screened for diseases.  Follow your health care provider's instructions on monitoring your cholesterol and blood pressure. This information is not intended to replace advice given to you by your health care provider. Make sure you discuss any questions you have with your health care provider. Document Revised: 10/12/2018 Document Reviewed: 10/12/2018 Elsevier Patient Education  2021 Else[employer] Inc.   Vaginal Yeast Infection, Adult  Vaginal yeast infection is a condition that causes vaginal discharge as well as soreness, swelling, and redness (inflammation) of the vagina. This is a common condition. Some women get this infection frequently. What are the causes? This condition is caused by a change in the normal balance of the yeast (candida) and bacteria that live in the vagina. This change causes an overgrowth of yeast, which causes the inflammation. What increases the risk? The condition is more likely to develop in women who:  Take antibiotic medicines.  Have diabetes.  Take birth control pills.  Are pregnant.  Douche often.    Have a weak body defense system (immune system).  Have been taking steroid medicines for a long time.  Frequently wear tight clothing. What are the signs or symptoms? Symptoms of this condition include:  White, thick, creamy vaginal discharge.  Swelling, itching, redness, and irritation of the vagina. The lips of the vagina (vulva) may be affected as well.  Pain or a burning feeling while urinating.  Pain during sex. How is this diagnosed? This condition is diagnosed based on:  Your medical history.  A physical exam.  A pelvic exam. Your health care provider will examine a sample of your vaginal discharge under a microscope. Your health care provider may send this sample for testing to confirm the diagnosis. How is this treated? This condition is treated with medicine. Medicines may be over-the-counter or  prescription. You may be told to use one or more of the following:  Medicine that is taken by mouth (orally).  Medicine that is applied as a cream (topically).  Medicine that is inserted directly into the vagina (suppository). Follow these instructions at home: Lifestyle  Do not have sex until your health care provider approves. Tell your sex partner that you have a yeast infection. That person should go to his or her health care provider and ask if they should also be treated.  Do not wear tight clothes, such as pantyhose or tight pants.  Wear breathable cotton underwear. General instructions  Take or apply over-the-counter and prescription medicines only as told by your health care provider.  Eat more yogurt. This may help to keep your yeast infection from returning.  Do not use tampons until your health care provider approves.  Try taking a sitz bath to help with discomfort. This is a warm water bath that is taken while you are sitting down. The water should only come up to your hips and should cover your buttocks. Do this 3-4 times per day or as told by your health care provider.  Do not douche.  If you have diabetes, keep your blood sugar levels under control.  Keep all follow-up visits as told by your health care provider. This is important.   Contact a health care provider if:  You have a fever.  Your symptoms go away and then return.  Your symptoms do not get better with treatment.  Your symptoms get worse.  You have new symptoms.  You develop blisters in or around your vagina.  You have blood coming from your vagina and it is not your menstrual period.  You develop pain in your abdomen. Summary  Vaginal yeast infection is a condition that causes discharge as well as soreness, swelling, and redness (inflammation) of the vagina.  This condition is treated with medicine. Medicines may be over-the-counter or prescription.  Take or apply over-the-counter and  prescription medicines only as told by your health care provider.  Do not douche. Do not have sex or use tampons until your health care provider approves.  Contact a health care provider if your symptoms do not get better with treatment or your symptoms go away and then return. This information is not intended to replace advice given to you by your health care provider. Make sure you discuss any questions you have with your health care provider. Document Revised: 05/19/2019 Document Reviewed: 03/07/2018 Elsevier Patient Education  2021 Else[employer] Inc.  Bacterial Vaginosis  Bacterial vaginosis is an infection that occurs when the normal balance of bacteria in the vagina changes. This change is caused by an   overgrowth of certain bacteria in the vagina. Bacterial vaginosis is the most common vaginal infection among females aged 15 to 44 years. This condition increases the risk of sexually transmitted infections (STIs). Treatment can help reduce this risk. Treatment is very important for pregnant women because this condition can cause babies to be born early (prematurely) or at a low birth weight. What are the causes? This condition is caused by an increase in harmful bacteria that are normally present in small amounts in the vagina. However, the exact reason this condition develops is not known. You cannot get bacterial vaginosis from toilet seats, bedding, swimming pools, or contact with objects around you. What increases the risk? The following factors may make you more likely to develop this condition:  Having a new sexual partner or multiple sexual partners, or having unprotected sex.  Douching.  Having an intrauterine device (IUD).  Smoking.  Abusing drugs and alcohol. This may lead to riskier sexual behavior.  Taking certain antibiotic medicines.  Being pregnant. What are the signs or symptoms? Some women with this condition have no symptoms. Symptoms may include:  Gray or white  vaginal discharge. The discharge can be watery or foamy.  A fish-like odor with discharge, especially after sex or during menstruation.  Itching in and around the vagina.  Burning or pain with urination. How is this diagnosed? This condition is diagnosed based on:  Your medical history.  A physical exam of the vagina.  Checking a sample of vaginal fluid for harmful bacteria or abnormal cells. How is this treated? This condition is treated with antibiotic medicines. These may be given as a pill, a vaginal cream, or a medicine that is put into the vagina (suppository). If the condition comes back after treatment, a second round of antibiotics may be needed. Follow these instructions at home: Medicines  Take or apply over-the-counter and prescription medicines only as told by your health care provider.  Take or apply your antibiotic medicine as told by your health care provider. Do not stop using the antibiotic even if you start to feel better. General instructions  If you have a female sexual partner, tell her that you have a vaginal infection. She should follow up with her health care provider. If you have a female sexual partner, he does not need treatment.  Avoid sexual activity until you finish treatment.  Drink enough fluid to keep your urine pale yellow.  Keep the area around your vagina and rectum clean. ? Wash the area daily with warm water. ? Wipe yourself from front to back after using the toilet.  If you are breastfeeding, talk to your health care provider about continuing breastfeeding during treatment.  Keep all follow-up visits. This is important. How is this prevented? Self-care  Do not douche.  Wash the outside of your vagina with warm water only.  Wear cotton or cotton-lined underwear.  Avoid wearing tight pants and pantyhose, especially during the summer. Safe sex  Use protection when having sex. This includes: ? Using condoms. ? Using dental dams.  This is a thin layer of a material made of latex or polyurethane that protects the mouth during oral sex.  Limit the number of sexual partners. To help prevent bacterial vaginosis, it is best to have sex with just one partner (monogamous relationship).  Make sure you and your sexual partner are tested for STIs. Drugs and alcohol  Do not use any products that contain nicotine or tobacco. These products include cigarettes, chewing tobacco, and   vaping devices, such as e-cigarettes. If you need help quitting, ask your health care provider.  Do not use drugs.  Do not drink alcohol if: ? Your health care provider tells you not to do this. ? You are pregnant, may be pregnant, or are planning to become pregnant.  If you drink alcohol: ? Limit how much you have to 0-1 drink a day. ? Be aware of how much alcohol is in your drink. In the [place] one drink equals one 12 oz bottle of beer (355 mL), one 5 oz glass of wine (148 mL), or one 1 oz glass of hard liquor (44 mL). Where to find more information  Centers for Disease Control and Prevention: www.cdc.gov  American Sexual Health Association (ASHA): www.ashastd.org  [place] Department of Health and Human Services, Office on Women's Health: www.womenshealth.gov Contact a health care provider if:  Your symptoms do not improve, even after treatment.  You have more discharge or pain when urinating.  You have a fever or chills.  You have pain in your abdomen or pelvis.  You have pain during sex.  You have vaginal bleeding between menstrual periods. Summary  Bacterial vaginosis is a vaginal infection that occurs when the normal balance of bacteria in the vagina changes. It results from an overgrowth of certain bacteria.  This condition increases the risk of sexually transmitted infections (STIs). Getting treated can help reduce this risk.  Treatment is very important for pregnant women because this condition can cause babies to be born early  (prematurely) or at low birth weight.  This condition is treated with antibiotic medicines. These may be given as a pill, a vaginal cream, or a medicine that is put into the vagina (suppository). This information is not intended to replace advice given to you by your health care provider. Make sure you discuss any questions you have with your health care provider. Document Revised: 04/18/2020 Document Reviewed: 04/18/2020 Elsevier Patient Education  2021 [employer] Inc.  

## 2020-11-15 LAB — C. TRACHOMATIS/N. GONORRHOEAE RNA
C. trachomatis RNA, TMA: NOT DETECTED
N. gonorrhoeae RNA, TMA: NOT DETECTED

## 2020-11-15 LAB — HEPATITIS C ANTIBODY
Hepatitis C Ab: NONREACTIVE
SIGNAL TO CUT-OFF: 0.08 (ref <1.00)

## 2020-11-15 LAB — RPR: RPR Ser Ql: NONREACTIVE

## 2020-11-15 LAB — HIV ANTIBODY (ROUTINE TESTING W REFLEX): HIV 1&2 Ab, 4th Generation: NONREACTIVE

## 2020-12-02 DIAGNOSIS — M9905 Segmental and somatic dysfunction of pelvic region: Secondary | ICD-10-CM | POA: Diagnosis not present

## 2020-12-02 DIAGNOSIS — M5386 Other specified dorsopathies, lumbar region: Secondary | ICD-10-CM | POA: Diagnosis not present

## 2020-12-02 DIAGNOSIS — R519 Headache, unspecified: Secondary | ICD-10-CM | POA: Diagnosis not present

## 2020-12-02 DIAGNOSIS — M9903 Segmental and somatic dysfunction of lumbar region: Secondary | ICD-10-CM | POA: Diagnosis not present

## 2020-12-06 DIAGNOSIS — R519 Headache, unspecified: Secondary | ICD-10-CM | POA: Diagnosis not present

## 2020-12-06 DIAGNOSIS — M9905 Segmental and somatic dysfunction of pelvic region: Secondary | ICD-10-CM | POA: Diagnosis not present

## 2020-12-06 DIAGNOSIS — M5386 Other specified dorsopathies, lumbar region: Secondary | ICD-10-CM | POA: Diagnosis not present

## 2020-12-06 DIAGNOSIS — M9903 Segmental and somatic dysfunction of lumbar region: Secondary | ICD-10-CM | POA: Diagnosis not present

## 2020-12-09 DIAGNOSIS — M5386 Other specified dorsopathies, lumbar region: Secondary | ICD-10-CM | POA: Diagnosis not present

## 2020-12-09 DIAGNOSIS — R519 Headache, unspecified: Secondary | ICD-10-CM | POA: Diagnosis not present

## 2020-12-09 DIAGNOSIS — M9903 Segmental and somatic dysfunction of lumbar region: Secondary | ICD-10-CM | POA: Diagnosis not present

## 2020-12-09 DIAGNOSIS — M9905 Segmental and somatic dysfunction of pelvic region: Secondary | ICD-10-CM | POA: Diagnosis not present

## 2020-12-13 DIAGNOSIS — M9903 Segmental and somatic dysfunction of lumbar region: Secondary | ICD-10-CM | POA: Diagnosis not present

## 2020-12-13 DIAGNOSIS — M5386 Other specified dorsopathies, lumbar region: Secondary | ICD-10-CM | POA: Diagnosis not present

## 2020-12-13 DIAGNOSIS — R519 Headache, unspecified: Secondary | ICD-10-CM | POA: Diagnosis not present

## 2020-12-13 DIAGNOSIS — M9905 Segmental and somatic dysfunction of pelvic region: Secondary | ICD-10-CM | POA: Diagnosis not present

## 2020-12-16 DIAGNOSIS — R519 Headache, unspecified: Secondary | ICD-10-CM | POA: Diagnosis not present

## 2020-12-16 DIAGNOSIS — M5386 Other specified dorsopathies, lumbar region: Secondary | ICD-10-CM | POA: Diagnosis not present

## 2020-12-16 DIAGNOSIS — M9903 Segmental and somatic dysfunction of lumbar region: Secondary | ICD-10-CM | POA: Diagnosis not present

## 2020-12-16 DIAGNOSIS — M9905 Segmental and somatic dysfunction of pelvic region: Secondary | ICD-10-CM | POA: Diagnosis not present

## 2020-12-19 DIAGNOSIS — R519 Headache, unspecified: Secondary | ICD-10-CM | POA: Diagnosis not present

## 2020-12-19 DIAGNOSIS — M9903 Segmental and somatic dysfunction of lumbar region: Secondary | ICD-10-CM | POA: Diagnosis not present

## 2020-12-19 DIAGNOSIS — M5386 Other specified dorsopathies, lumbar region: Secondary | ICD-10-CM | POA: Diagnosis not present

## 2020-12-19 DIAGNOSIS — M9905 Segmental and somatic dysfunction of pelvic region: Secondary | ICD-10-CM | POA: Diagnosis not present

## 2020-12-26 DIAGNOSIS — M5386 Other specified dorsopathies, lumbar region: Secondary | ICD-10-CM | POA: Diagnosis not present

## 2020-12-26 DIAGNOSIS — R519 Headache, unspecified: Secondary | ICD-10-CM | POA: Diagnosis not present

## 2020-12-26 DIAGNOSIS — M9905 Segmental and somatic dysfunction of pelvic region: Secondary | ICD-10-CM | POA: Diagnosis not present

## 2020-12-26 DIAGNOSIS — M9903 Segmental and somatic dysfunction of lumbar region: Secondary | ICD-10-CM | POA: Diagnosis not present

## 2020-12-30 DIAGNOSIS — M9905 Segmental and somatic dysfunction of pelvic region: Secondary | ICD-10-CM | POA: Diagnosis not present

## 2020-12-30 DIAGNOSIS — M5386 Other specified dorsopathies, lumbar region: Secondary | ICD-10-CM | POA: Diagnosis not present

## 2020-12-30 DIAGNOSIS — M9903 Segmental and somatic dysfunction of lumbar region: Secondary | ICD-10-CM | POA: Diagnosis not present

## 2020-12-30 DIAGNOSIS — R519 Headache, unspecified: Secondary | ICD-10-CM | POA: Diagnosis not present

## 2021-02-17 ENCOUNTER — Other Ambulatory Visit: Payer: Self-pay

## 2021-03-25 ENCOUNTER — Other Ambulatory Visit: Payer: Self-pay

## 2021-03-25 ENCOUNTER — Ambulatory Visit (INDEPENDENT_AMBULATORY_CARE_PROVIDER_SITE_OTHER): Payer: No Typology Code available for payment source | Admitting: Nurse Practitioner

## 2021-03-25 ENCOUNTER — Encounter: Payer: Self-pay | Admitting: Nurse Practitioner

## 2021-03-25 VITALS — BP 112/78 | HR 51 | Temp 97.6°F | Ht 67.0 in | Wt 153.8 lb

## 2021-03-25 DIAGNOSIS — M62838 Other muscle spasm: Secondary | ICD-10-CM | POA: Diagnosis not present

## 2021-03-25 DIAGNOSIS — G43709 Chronic migraine without aura, not intractable, without status migrainosus: Secondary | ICD-10-CM

## 2021-03-25 MED ORDER — CYCLOBENZAPRINE HCL 5 MG PO TABS: 5.0000 mg | ORAL_TABLET | Freq: Every evening | ORAL | 0 refills | Status: DC | PRN

## 2021-03-25 MED ORDER — SUMATRIPTAN SUCCINATE 25 MG PO TABS: 25.0000 mg | ORAL_TABLET | ORAL | 0 refills | Status: DC | PRN

## 2021-03-25 NOTE — Progress Notes (Signed)
Subjective:  Patient ID: Amber Ware, female    DOB: 11-Jan-1981  Age: 40 y.o. MRN: [id number]  CC: Headache (Pt c/o headaches and neck pain x 2 months worsening,  pt has been taking ibuprofen and has tried massage therapy w little relief. )  Migraine  This is a recurrent problem. The current episode started more than 1 month ago. The problem occurs intermittently. The problem has been unchanged. The pain is located in the occipital and right unilateral region. The pain does not radiate. The pain quality is similar to prior headaches. The quality of the pain is described as aching (tightness around shoulders and neck). Associated symptoms include nausea and photophobia. Pertinent negatives include no abdominal pain, abnormal behavior, anorexia, back pain, blurred vision, coughing, dizziness, drainage, ear pain, eye pain, eye redness, eye watering, facial sweating, fever, hearing loss, insomnia, loss of balance, muscle aches, neck pain, numbness, phonophobia, rhinorrhea, scalp tenderness, seizures, sinus pressure, sore throat, swollen glands, tingling, tinnitus, visual change, vomiting, weakness or weight loss. The symptoms are aggravated by activity (weight lifting or upper body exercise with weight training). She has tried NSAIDs for the symptoms. The treatment provided mild relief. Her past medical history is significant for migraine headaches. There is no history of cancer, cluster headaches, hypertension, immunosuppression, migraines in the family, obesity, pseudotumor cerebri, recent head traumas or sinus disease.  She is concerned about increase migraine frequency which has led to limiting exercise regimen and missed work days. minimal improvement with chiropractor adjustment or dry needling or stretching exercise. Current use of COC since 2019: no change No head injury, no seizures, no chronic sinusitis.  Reviewed past Medical, Social and Family history today.  Outpatient Medications Prior  to Visit  Medication Sig Dispense Refill  . Norgestimate-Ethinyl Estradiol Triphasic (ORTHO TRI-CYCLEN LO) 0.18/0.215/0.25 MG-25 MCG tab Take 1 tablet by mouth daily. 84 tablet 4  . metroNIDAZOLE (FLAGYL) 500 MG tablet Take 1 tablet (500 mg total) by mouth 2 (two) times daily. (Patient not taking: Reported on 03/25/2021) 14 tablet 0   No facility-administered medications prior to visit.    ROS See HPI  Objective:  BP 112/78 (BP Location: Right Arm, Patient Position: Sitting, Cuff Size: Normal)   Pulse (!) 51   Temp 97.6 F (36.4 C) (Temporal)   Ht 5\' 7"  (1.702 m)   Wt 153 lb 12.8 oz (69.8 kg)   LMP 03/17/2021   SpO2 99%   BMI 24.09 kg/m   Physical Exam Vitals reviewed.  Eyes:     Extraocular Movements: Extraocular movements intact.     Pupils: Pupils are equal, round, and reactive to light.  Neck:     Thyroid: No thyroid mass or thyroid tenderness.  Cardiovascular:     Rate and Rhythm: Normal rate.     Heart sounds: Normal heart sounds.  Pulmonary:     Effort: Pulmonary effort is normal.  Musculoskeletal:     Cervical back: Normal range of motion and neck supple. Tenderness present. No rigidity, spasms, torticollis or bony tenderness. Pain with movement present. Normal range of motion.     Thoracic back: Normal.  Lymphadenopathy:     Cervical: No cervical adenopathy.  Neurological:     Mental Status: She is alert and oriented to person, place, and time.     Cranial Nerves: No cranial nerve deficit.  Psychiatric:        Mood and Affect: Mood normal.        Behavior: Behavior normal.  Assessment & Plan:  This visit occurred during the SARS-CoV-2 public health emergency.  Safety protocols were in place, including screening questions prior to the visit, additional usage of staff PPE, and extensive cleaning of exam room while observing appropriate contact time as indicated for disinfecting solutions.   Shelah was seen today for headache.  Diagnoses and all orders for  this visit:  Cervical paraspinal muscle spasm -     cyclobenzaprine (FLEXERIL) 5 MG tablet; Take 1-2 tablets (5-10 mg total) by mouth at bedtime as needed for muscle spasms. -     Ambulatory referral to Physical Therapy  Chronic migraine without aura without status migrainosus, not intractable -     SUMAtriptan (IMITREX) 25 MG tablet; Take 1 tablet (25 mg total) by mouth every 2 (two) hours as needed for migraine. No more than 100mg  in 24hrs  She decline referral to neurology. She wants to try outpatient PT first and above medications  Problem List Items Addressed This Visit   None   Visit Diagnoses    Cervical paraspinal muscle spasm    -  Primary   Relevant Medications   cyclobenzaprine (FLEXERIL) 5 MG tablet   Other Relevant Orders   Ambulatory referral to Physical Therapy   Chronic migraine without aura without status migrainosus, not intractable       Relevant Medications   SUMAtriptan (IMITREX) 25 MG tablet   cyclobenzaprine (FLEXERIL) 5 MG tablet      Follow-up: Return in about 3 months (around 06/25/2021) for CPE (fasting).  Wilfred Lacy, NP

## 2021-04-14 ENCOUNTER — Ambulatory Visit: Payer: No Typology Code available for payment source | Attending: Nurse Practitioner

## 2021-04-14 ENCOUNTER — Other Ambulatory Visit: Payer: Self-pay

## 2021-04-14 DIAGNOSIS — M62838 Other muscle spasm: Secondary | ICD-10-CM

## 2021-04-14 DIAGNOSIS — G43709 Chronic migraine without aura, not intractable, without status migrainosus: Secondary | ICD-10-CM

## 2021-04-14 DIAGNOSIS — M6281 Muscle weakness (generalized): Secondary | ICD-10-CM | POA: Insufficient documentation

## 2021-04-14 NOTE — Patient Instructions (Signed)
Access Code: [email or web address] URL: [email or web address] Date: 04/14/2021 Prepared by: Rich Number  Exercises Supine Deep Neck Flexor Training - 2 x daily - 7 x weekly - 1 sets - 15 reps - 5 hold

## 2021-04-14 NOTE — Therapy (Signed)
[place], [place], [address] Phone: [phone]   Fax:  [phone]  Physical Therapy Evaluation  Patient Details  Name: Amber Ware MRN: [id number] Date of Birth: [birth year] Referring Provider (PT): Nche   Encounter Date: 04/14/2021   PT End of Session - 04/14/21 0925     Visit Number 1    Number of Visits 17    Authorization Type Aetna    PT Start Time 0848    PT Stop Time 0927    PT Time Calculation (min) 39 min    Activity Tolerance Patient tolerated treatment well    Behavior During Therapy [facility] for tasks assessed/performed             Past Medical History:  Diagnosis Date   Abnormal Pap smear of cervix 2014   Polycystic ovarian syndrome     Past Surgical History:  Procedure Laterality Date   CERVICAL BIOPSY  W/ LOOP ELECTRODE EXCISION  2014   COLPOSCOPY  2014    There were no vitals filed for this visit.    Subjective Assessment - 04/14/21 0848     Subjective Amber Ware reports that she has been having several years of bilateral shoulder tightness and she has been getting migraines from the tension.  She reports that she has avoided working out due to the fear of the headaches coming on.  Her symptoms have continued to progressively get worse.  Amber Ware reports that she went to [place] last week.  She did go on rides.  She reports having a headache all week. She has tightness in the back of the neck and the headaches goes across the skull.  Resting her head on the pillow makes the headaches and pressure better.  They come on once she lifts the head off the pillow.  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  Her pain is worse when gets home and relaxes.  She does not notice it while working.    Limitations Lifting;Sitting;House hold activities;Other (comment)   Working out   How long can you sit comfortably? 1 hour    How long can you stand comfortably? 15-30  minutes    Patient Stated Goals To get rid of the constant pain/tension and headaches    Currently in Pain? Yes    Pain Score 5     Pain Location Neck    Pain Orientation Left;Right    Pain Descriptors / Indicators Dull    Pain Onset More than a month ago    Pain Frequency Constant    Aggravating Factors  Lifting, carrying, as the day progressing, holding her head up    Pain Relieving Factors laying down and resting    Effect of Pain on Daily Activities [place] working out, constant headaches                [facility] PT Assessment - 04/14/21 0001       Assessment   Medical Diagnosis neck pain    Referring Provider (PT) Nche    Onset Date/Surgical Date --   over several years   Hand Dominance Left    Prior Therapy Yes      Precautions   Precautions None      Restrictions   Weight Bearing Restrictions No      Balance Screen   Has the patient fallen in the past 6 months No    Has the patient had a decrease in activity  level because of a fear of falling?  No    Is the patient reluctant to leave their home because of a fear of falling?  No      Home Ecologist residence    Living Arrangements Spouse/significant other    Type of [place]      Prior Function   Level of [place] Full time employment    Investment banker, corporate    Leisure [employer], some crossfit 3x/week      Cognition   Overall Cognitive Status Within Functional Limits for tasks assessed      Observation/Other Assessments   Focus on Therapeutic Outcomes (FOTO)  46% Neck, Predicted 61%      AROM   Cervical Flexion 50 deg    Cervical Extension 45 deg    Cervical - Right Side Bend 35 deg    Cervical - Left Side Bend 40 deg    Cervical - Right Rotation 70 deg (radiate to R scapula)    Cervical - Left Rotation 70 deg      Strength   Overall Strength Comments Bilateral scapular strength 4-/5 grossly      Palpation    Palpation comment hypertonicity B UTs, Levator, and cervical paraspinals.                        Objective measurements completed on examination: See above findings.       [facility] Adult PT Treatment/Exercise - 04/14/21 0001       Exercises   Exercises Shoulder;Neck      Neck Exercises: Supine   Neck Retraction 15 reps;5 secs      Manual Therapy   Manual therapy comments Manual palpation and assessment during FDN    Soft tissue mobilization SOR    Manual Traction grade II cervical traction supine              Trigger Point Dry Needling - 04/14/21 0001     Consent Given? Yes    Dry Needling Comments patient educated on beneifits and risks of FDN prior to giving consent    Other Dry Needling manual palpation and assessment during FDN    Upper Trapezius Response Twitch reponse elicited                  PT Education - 04/14/21 0940     Education Details POC, HEP, FOTO, Taping management, appropriate activities.    Person(s) Educated Patient    Methods Explanation    Comprehension Verbalized understanding              PT Short Term Goals - 04/14/21 0941       PT SHORT TERM GOAL #1   Title The patient will be independent with a HEP of stabilization and stretches    Baseline no HEP    Time 2    Period Weeks    Status New    Target Date 04/28/21      PT SHORT TERM GOAL #2   Title The patient will report reduction of headaches from constant to intermittent    Baseline constant    Time 2    Period Weeks    Status New    Target Date 04/28/21               PT Long Term Goals - 04/14/21 0942       PT LONG TERM GOAL #1  Title The patient will score a 61% or better on FOTO    Baseline 46%    Time 8    Period Weeks    Status New    Target Date 06/09/21      PT LONG TERM GOAL #2   Title The patient will present with improvement of gross scapular strength to 4+/5    Baseline 4-/5    Time 8    Period Weeks    Status New     Target Date 06/09/21      PT LONG TERM GOAL #3   Title The patient report reduction of headaches to 1x/week for less.    Baseline constant    Time 8    Period Weeks    Status New    Target Date 06/09/21                    Plan - 04/14/21 0926     Clinical Impression Statement Amber Ware is a 40 y/o female who reports several years of neck tension that has progressed over the years.  She went to [facility] and went on rides last week.  She has been having a constant headache since then.  The patient presents with cervical AROM within functional limits.  She has muscle guarding of the cervical spine and upper trapes.  She presents with scapular and cervical stabilization weakness.  Recommend physical therapy to reduce headaches, reduce muscle guarding, cervical/scapular stabilization, and improvement of functional activities.    Examination-Activity Limitations Bend;Carry;Sit;Lift    Examination-Participation Restrictions Driving;Occupation;Cleaning;Meal Prep;Yard Work    Stability/Clinical Decision Making Stable/Uncomplicated    Clinical Decision Making Low    Rehab Potential Good    PT Frequency 2x / week    PT Duration 8 weeks    PT Treatment/Interventions ADLs/Self Care Home Management;Cryotherapy;Electrical Stimulation;Moist Heat;Traction;Ultrasound;Therapeutic exercise;Therapeutic activities;Patient/family education;Manual techniques;Dry needling;Aquatic Therapy;Functional mobility training;Neuromuscular re-education;Taping;Spinal Manipulations;Joint Manipulations    PT Next Visit Plan Review HEP, progress cervical /scapular strabilization, neck stretches, self subocciptal release    PT Home Exercise Plan Access Code: [email or web address]    Consulted and Agree with Plan of Care Patient             Patient will benefit from skilled therapeutic intervention in order to improve the following deficits and impairments:  Pain, Decreased activity tolerance, Decreased strength  Visit  Diagnosis: Chronic migraine without aura without status migrainosus, not intractable - Plan: PT plan of care cert/re-cert  Cervical paraspinal muscle spasm - Plan: PT plan of care cert/re-cert     Problem List 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 DN Bethena Midget 04/14/2021, 9:56 AM  [facility] [address] [place], [place], [address] Phone: [phone]   Fax:  [phone]  Name: Amber Ware MRN: [id number] Date of Birth: July 24, 1981

## 2021-04-17 ENCOUNTER — Other Ambulatory Visit: Payer: Self-pay

## 2021-04-17 ENCOUNTER — Ambulatory Visit: Payer: No Typology Code available for payment source

## 2021-04-17 DIAGNOSIS — M62838 Other muscle spasm: Secondary | ICD-10-CM | POA: Diagnosis not present

## 2021-04-17 DIAGNOSIS — G43709 Chronic migraine without aura, not intractable, without status migrainosus: Secondary | ICD-10-CM

## 2021-04-17 DIAGNOSIS — M6281 Muscle weakness (generalized): Secondary | ICD-10-CM

## 2021-04-17 NOTE — Therapy (Signed)
[place], [place], [address] Phone: [phone]   Fax:  [phone]  Physical Therapy Treatment  Patient Details  Name: Amber Ware MRN: [id number] Date of Birth: [birth year] Referring Provider (PT): Flossie Buffy   Encounter Date: 04/17/2021   PT End of Session - 04/17/21 0758     Visit Number 2    Number of Visits 17    Date for PT Re-Evaluation 10/08/18    Authorization Type Aetna    PT Start Time 0800    PT Stop Time 9417    PT Time Calculation (min) 47 min    Activity Tolerance Patient tolerated treatment well    Behavior During Therapy [facility] for tasks assessed/performed             Past Medical History:  Diagnosis Date   Abnormal Pap smear of cervix 2014   Polycystic ovarian syndrome     Past Surgical History:  Procedure Laterality Date   CERVICAL BIOPSY  W/ LOOP ELECTRODE EXCISION  2014   COLPOSCOPY  2014    There were no vitals filed for this visit.   Subjective Assessment - 04/17/21 0845     Subjective The patient reports that her headaches are now intermittent.  She had one really good day since the initial assessment.  She has just some tension today.    Limitations Lifting;Sitting;House hold activities;Other (comment)    How long can you sit comfortably? 1 hour    How long can you stand comfortably? 15-30 minutes    Patient Stated Goals To get rid of the constant pain/tension and headaches    Currently in Pain? Yes    Pain Score 2     Pain Location Neck                [facility] PT Assessment - 04/17/21 0001       Assessment   Medical Diagnosis neck pain    Referring Provider (PT) Lorayne Marek Charlene Brooke                           [facility] Adult PT Treatment/Exercise - 04/17/21 0001       Neck Exercises: Theraband   Shoulder External Rotation 15 reps;Green    Shoulder External Rotation Limitations supine 5 sec hold      Neck Exercises:  Supine   Cervical Isometrics Right rotation;Left rotation;5 secs;10 reps    Cervical Isometrics Limitations manual resistance    Neck Retraction 15 reps;5 secs      Neck Exercises: Sidelying   Other Sidelying Exercise Rotation x 10 each      Manual Therapy   Manual therapy comments Manual palpation and assessment during FDN    Joint Mobilization C0-1 distraction grade II    Soft tissue mobilization SOR, TrP release B levator, STM B cervical para spinals.    Manual Traction grade II cervical traction supine              Trigger Point Dry Needling - 04/17/21 0001     Consent Given? Yes    Dry Needling Comments patient educated on beneifits and risks of FDN prior to giving consent    Other Dry Needling manual palpation and assessment during FDN    Upper Trapezius Response Twitch reponse elicited    Suboccipitals Response Palpable increased muscle length    Levator Scapulae Response Twitch response elicited    Cervical multifidi Response Palpable increased  muscle length                  PT Education - 04/17/21 0839     Education Details HEP, self massage w/ tennis balls, SOR    Person(s) Educated Patient    Methods Explanation;Demonstration;Handout    Comprehension Verbalized understanding;Returned demonstration              PT Short Term Goals - 04/14/21 0941       PT SHORT TERM GOAL #1   Title The patient will be independent with a HEP of stabilization and stretches    Baseline no HEP    Time 2    Period Weeks    Status New    Target Date 04/28/21      PT SHORT TERM GOAL #2   Title The patient will report reduction of headaches from constant to intermittent    Baseline constant    Time 2    Period Weeks    Status New    Target Date 04/28/21               PT Long Term Goals - 04/14/21 0942       PT LONG TERM GOAL #1   Title The patient will score a 61% or better on FOTO    Baseline 46%    Time 8    Period Weeks    Status New     Target Date 06/09/21      PT LONG TERM GOAL #2   Title The patient will present with improvement of gross scapular strength to 4+/5    Baseline 4-/5    Time 8    Period Weeks    Status New    Target Date 06/09/21      PT LONG TERM GOAL #3   Title The patient report reduction of headaches to 1x/week for less.    Baseline constant    Time 8    Period Weeks    Status New    Target Date 06/09/21                   Plan - 04/17/21 0813     Clinical Impression Statement Amber Ware reports that her headaches are now intermittent.  She had one really good day since that last session.  The patient continued to have increase of muscle tonicity in the cervical spine.  Continued with FDN to reduce muscle tonicity and pain.  Performed STM, manual traction, and C0-C1 distraction post the dry needling.  The patient was educated on self soft tissue mobilization with tennis balls for the suboccipitals and scapular musculature.  Cervical stabilization exercises were progressed with sidelying rotation and Tband scpaular stabilization with shoulder ER.  She tolerated these well in therapy today.  They were added to her HEP.  Recommend continued therapy for reduction of headaches and cervical/scapular stabilization.    Examination-Activity Limitations Bend;Carry;Sit;Lift    Examination-Participation Restrictions Driving;Occupation;Cleaning;Meal Prep;[employer]aver Work    PT Treatment/Interventions ADLs/Self Care Home Management;Cryotherapy;Electrical Stimulation;Moist Heat;Traction;Ultrasound;Therapeutic exercise;Therapeutic activities;Patient/family education;Manual techniques;Dry needling;Aquatic Therapy;Functional mobility training;Neuromuscular re-education;Taping;Spinal Manipulations;Joint Manipulations    PT Next Visit Plan Review HEP, progress cervical /scapular strabilization, neck stretches, self subocciptal release, quadruped scapular/cervical stabilization exercises.    PT Home Exercise Plan Access  Code: [email or web address]    Consulted and Agree with Plan of Care Patient             Patient will benefit from skilled therapeutic intervention in order to improve the following  deficits and impairments:  Pain, Decreased activity tolerance, Decreased strength  Visit Diagnosis: Cervical paraspinal muscle spasm  Chronic migraine without aura without status migrainosus, not intractable  Muscle weakness (generalized)     Problem List 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 DN Bethena Midget 04/17/2021, 8:59 AM  [facility] [address] [place], [place], [address] Phone: [phone]   Fax:  [phone]  Name: Amber Ware MRN: [id number] Date of Birth: 1981-07-20

## 2021-04-17 NOTE — Patient Instructions (Signed)
Access Code: [email or web address] URL: [email or web address] Date: 04/17/2021 Prepared by: Rich Number  Exercises Supine Deep Neck Flexor Training - 2 x daily - 7 x weekly - 1 sets - 15 reps - 5 hold Shoulder External Rotation and Scapular Retraction with Resistance - 2 x daily - 7 x weekly - 1 sets - 15 reps - 5 hold Sidelying Cervical Rotation - 1 x daily - 7 x weekly - 2 sets - 10 reps Supine Suboccipital Release with Tennis Balls - 1 x daily - 7 x weekly - 3 sets - 10 reps Supine Mid-Thoracic Mobilization - 1 x daily - 7 x weekly - 3 sets - 10 reps

## 2021-04-21 ENCOUNTER — Ambulatory Visit: Payer: No Typology Code available for payment source | Admitting: Physical Therapy

## 2021-04-21 ENCOUNTER — Encounter: Payer: Self-pay | Admitting: Physical Therapy

## 2021-04-21 ENCOUNTER — Other Ambulatory Visit: Payer: Self-pay

## 2021-04-21 DIAGNOSIS — G43709 Chronic migraine without aura, not intractable, without status migrainosus: Secondary | ICD-10-CM

## 2021-04-21 DIAGNOSIS — M6281 Muscle weakness (generalized): Secondary | ICD-10-CM | POA: Diagnosis not present

## 2021-04-21 DIAGNOSIS — M62838 Other muscle spasm: Secondary | ICD-10-CM | POA: Diagnosis not present

## 2021-04-21 NOTE — Therapy (Signed)
[place], [place], [address] Phone: [phone]   Fax:  [phone]  Physical Therapy Treatment  Patient Details  Name: Amber Ware MRN: [id number] Date of Birth: [birth year] Referring Provider (PT): Flossie Buffy   Encounter Date: 04/21/2021   PT End of Session - 04/21/21 1057     Visit Number 3    Number of Visits 17    Date for PT Re-Evaluation 10/08/18    Authorization Type [employer]- Needs Medical review after 25th visit    PT Start Time 1102    PT Stop Time 1143    PT Time Calculation (min) 41 min             Past Medical History:  Diagnosis Date   Abnormal Pap smear of cervix 2014   Polycystic ovarian syndrome     Past Surgical History:  Procedure Laterality Date   CERVICAL BIOPSY  W/ LOOP ELECTRODE EXCISION  2014   COLPOSCOPY  2014    There were no vitals filed for this visit.   Subjective Assessment - 04/21/21 1104     Subjective I feel like the neck is stronger and I have not had headahce in two days. I still have tendion in my shoulder and down into the right side of neck.    Currently in Pain? Yes    Pain Score 2     Pain Location Neck    Pain Orientation Right;Left    Pain Descriptors / Indicators Tightness    Pain Type Acute pain    Aggravating Factors  end of the work day    Pain Relieving Factors laying down, therapy                               [facility] Adult PT Treatment/Exercise - 04/21/21 0001       Neck Exercises: Theraband   Shoulder External Rotation 15 reps;Green    Shoulder External Rotation Limitations supine 5 sec hold    Horizontal ABduction 15 reps    Horizontal ABduction Limitations supine 5 sec hold      Neck Exercises: Supine   Neck Retraction 15 reps;5 secs    Neck Retraction Limitations towel roll      Neck Exercises: Prone   Other Prone Exercise Qped Alt UE flexion with cues to maintain neutral spin/ chin tucked x  10 , added opp LE x 10      Neck Exercises: Stretches   Upper Trapezius Stretch 30 seconds;2 reps    Levator Stretch 30 seconds;2 reps    Other Neck Stretches open books x 10 each way                      PT Short Term Goals - 04/21/21 1153       PT SHORT TERM GOAL #1   Title The patient will be independent with a HEP of stabilization and stretches    Period Weeks    Status Achieved    Target Date 04/28/21      PT SHORT TERM GOAL #2   Title The patient will report reduction of headaches from constant to intermittent    Baseline no headaches over last 2 days: 04/21/21    Time 2    Period Weeks    Status Achieved               PT Long Term Goals -  04/14/21 0942       PT LONG TERM GOAL #1   Title The patient will score a 61% or better on FOTO    Baseline 46%    Time 8    Period Weeks    Status New    Target Date 06/09/21      PT LONG TERM GOAL #2   Title The patient will present with improvement of gross scapular strength to 4+/5    Baseline 4-/5    Time 8    Period Weeks    Status New    Target Date 06/09/21      PT LONG TERM GOAL #3   Title The patient report reduction of headaches to 1x/week for less.    Baseline constant    Time 8    Period Weeks    Status New    Target Date 06/09/21                   Plan - 04/21/21 1119     Clinical Impression Statement Pt reports pain and headaches improved however tighness remains in her upper traps and right side of back. She notes decreased pain with lifting and carrying over the last 2 days as well as no headaches. Progressed with neck stretches and qped stabilization per PT POC. SHe tolerated all additional therex without increased pain,  updated HEP. Manual STW to bilateral upper traps performed to further decrease tension. She declined modalities at end of session and reported feeling good. STG# 1, #2 met.    PT Next Visit Plan Review HEP, progress cervical /scapular strabilization, neck  stretches, self subocciptal release, quadruped scapular/cervical stabilization exercises.    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 [place], [place], [address] Phone: [phone]   Fax:  [phone]  Name: Amber Ware MRN: [id number] Date of Birth: Apr 19, 1981

## 2021-04-24 ENCOUNTER — Other Ambulatory Visit: Payer: Self-pay

## 2021-04-24 ENCOUNTER — Encounter: Payer: Self-pay | Admitting: Physical Therapy

## 2021-04-24 ENCOUNTER — Ambulatory Visit: Payer: No Typology Code available for payment source | Admitting: Physical Therapy

## 2021-04-24 DIAGNOSIS — G43709 Chronic migraine without aura, not intractable, without status migrainosus: Secondary | ICD-10-CM

## 2021-04-24 DIAGNOSIS — M62838 Other muscle spasm: Secondary | ICD-10-CM | POA: Diagnosis not present

## 2021-04-24 DIAGNOSIS — M6281 Muscle weakness (generalized): Secondary | ICD-10-CM

## 2021-04-24 NOTE — Therapy (Signed)
[place], [place], [address] Phone: [phone]   Fax:  [phone]  Physical Therapy Treatment  Patient Details  Name: Amber Ware MRN: [id number] Date of Birth: [birth year] Referring Provider (PT): Flossie Buffy   Encounter Date: 04/24/2021   PT End of Session - 04/24/21 0803     Visit Number 4    Number of Visits 17    Date for PT Re-Evaluation 06/09/21    Authorization Type [employer]- Needs Medical review after 25th visit    PT Start Time 0800    PT Stop Time 0845    PT Time Calculation (min) 45 min             Past Medical History:  Diagnosis Date   Abnormal Pap smear of cervix 2014   Polycystic ovarian syndrome     Past Surgical History:  Procedure Laterality Date   CERVICAL BIOPSY  W/ LOOP ELECTRODE EXCISION  2014   COLPOSCOPY  2014    There were no vitals filed for this visit.   Subjective Assessment - 04/24/21 0802     Subjective 5/10 tightness still there but no headache.    Currently in Pain? Yes    Pain Score 5     Pain Location Neck    Pain Orientation Right;Left    Pain Descriptors / Indicators Tightness    Pain Type Acute pain                               [facility] Adult PT Treatment/Exercise - 04/24/21 0001       Self-Care   Other Self-Care Comments  Use of Theracane for self Trigger point release to uppertraps/ upper back      Neck Exercises: Theraband   Shoulder Extension 15 reps    Shoulder Extension Limitations Red    Rows 15 reps    Rows Limitations Green    Shoulder External Rotation 15 reps;Green    Shoulder External Rotation Limitations standing  5 sec hold    Horizontal ABduction 15 reps    Horizontal ABduction Limitations standing      Neck Exercises: Supine   Neck Retraction 15 reps;5 secs    Neck Retraction Limitations towel roll      Neck Exercises: Sidelying   Other Sidelying Exercise Rotation x 10 each      Neck  Exercises: Prone   Shoulder Extension Limitations with scap retract 5 sec x 15    Other Prone Exercise Qped Bird dogs with cues for chin tuck and abdominal draw in    Other Prone Exercise Qped alternating thoracic rotation x 10      Neck Exercises: Stretches   Upper Trapezius Stretch 30 seconds;2 reps    Levator Stretch 30 seconds;2 reps                      PT Short Term Goals - 04/21/21 1153       PT SHORT TERM GOAL #1   Title The patient will be independent with a HEP of stabilization and stretches    Period Weeks    Status Achieved    Target Date 04/28/21      PT SHORT TERM GOAL #2   Title The patient will report reduction of headaches from constant to intermittent    Baseline no headaches over last 2 days: 04/21/21    Time 2  Period Weeks    Status Achieved               PT Long Term Goals - 04/14/21 0942       PT LONG TERM GOAL #1   Title The patient will score a 61% or better on FOTO    Baseline 46%    Time 8    Period Weeks    Status New    Target Date 06/09/21      PT LONG TERM GOAL #2   Title The patient will present with improvement of gross scapular strength to 4+/5    Baseline 4-/5    Time 8    Period Weeks    Status New    Target Date 06/09/21      PT LONG TERM GOAL #3   Title The patient report reduction of headaches to 1x/week for less.    Baseline constant    Time 8    Period Weeks    Status New    Target Date 06/09/21                   Plan - 04/24/21 0845     Clinical Impression Statement Tyesha reports continued tightness in upper traps R>L. Progressed with standing and prone scap stabilization  and reviewed qped stabilization. She tolerated all therex reporting that it felt good. Introduced Associate Professor for self trigger point release and she was very interested in having one for home.    PT Next Visit Plan Review HEP, progress cervical /scapular strabilization, neck stretches, self subocciptal release,  quadruped scapular/cervical stabilization exercises.    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

## 2021-04-25 ENCOUNTER — Other Ambulatory Visit: Payer: Self-pay | Admitting: Nurse Practitioner

## 2021-04-25 DIAGNOSIS — G43709 Chronic migraine without aura, not intractable, without status migrainosus: Secondary | ICD-10-CM

## 2021-04-28 ENCOUNTER — Other Ambulatory Visit: Payer: Self-pay

## 2021-04-28 ENCOUNTER — Ambulatory Visit: Payer: No Typology Code available for payment source

## 2021-04-28 DIAGNOSIS — M62838 Other muscle spasm: Secondary | ICD-10-CM

## 2021-04-28 DIAGNOSIS — G43709 Chronic migraine without aura, not intractable, without status migrainosus: Secondary | ICD-10-CM | POA: Diagnosis not present

## 2021-04-28 DIAGNOSIS — M6281 Muscle weakness (generalized): Secondary | ICD-10-CM

## 2021-04-28 NOTE — Therapy (Signed)
[place], [place], [address] Phone: [phone]   Fax:  [phone]  Physical Therapy Treatment  Patient Details  Name: Amber Ware MRN: [id number] Date of Birth: 18-Aug-1981 Referring Provider (PT): Flossie Buffy   Encounter Date: 04/28/2021   PT End of Session - 04/28/21 1127     Visit Number 5    Number of Visits 17    Date for PT Re-Evaluation 06/09/21    Authorization Type [employer]- Needs Medical review after 25th visit    Authorization Time Period 6th visit FOTO, 10th visit FOTO    PT Start Time 1100    PT Stop Time 1145    PT Time Calculation (min) 45 min    Activity Tolerance Patient tolerated treatment well    Behavior During Therapy [facility] for tasks assessed/performed             Past Medical History:  Diagnosis Date   Abnormal Pap smear of cervix 2014   Polycystic ovarian syndrome     Past Surgical History:  Procedure Laterality Date   CERVICAL BIOPSY  W/ LOOP ELECTRODE EXCISION  2014   COLPOSCOPY  2014    There were no vitals filed for this visit.   Subjective Assessment - 04/28/21 1103     Subjective The patient reports that she has not had a headaches.  She does get one after the dry needling last session.    Limitations Lifting;Sitting;House hold activities;Other (comment)                [facility] PT Assessment - 04/28/21 0001       Assessment   Medical Diagnosis neck pain    Referring Provider (PT) Nche, Charlene Brooke                           [facility] Adult PT Treatment/Exercise - 04/28/21 0001       Neck Exercises: Supine   Neck Retraction 10 reps;5 secs    Capital Flexion 10 reps    Capital Flexion Limitations with pillow      Shoulder Exercises: Prone   Extension Strengthening;Both;15 reps;Weights    Extension Weight (lbs) 3    Other Prone Exercises Quadruped anterior weight shifts x 10   maintain Neck in neutral     Manual  Therapy   Manual therapy comments Manual palpation and assessment during FDN    Joint Mobilization C0-1 distraction grade II    Soft tissue mobilization SOR, TrP release B levator, STM B cervical para spinals.    Manual Traction grade II cervical traction supine              Trigger Point Dry Needling - 04/28/21 0001     Consent Given? Yes    Dry Needling Comments patient educated on beneifits and risks of FDN prior to giving consent    Other Dry Needling manual palpation and assessment during FDN    Upper Trapezius Response Twitch reponse elicited    Suboccipitals Response Palpable increased muscle length    Levator Scapulae Response Twitch response elicited    Cervical multifidi Response Palpable increased muscle length                    PT Short Term Goals - 04/21/21 1153       PT SHORT TERM GOAL #1   Title The patient will be independent with a HEP of stabilization and stretches  Period Weeks    Status Achieved    Target Date 04/28/21      PT SHORT TERM GOAL #2   Title The patient will report reduction of headaches from constant to intermittent    Baseline no headaches over last 2 days: 04/21/21    Time 2    Period Weeks    Status Achieved               PT Long Term Goals - 04/14/21 0942       PT LONG TERM GOAL #1   Title The patient will score a 61% or better on FOTO    Baseline 46%    Time 8    Period Weeks    Status New    Target Date 06/09/21      PT LONG TERM GOAL #2   Title The patient will present with improvement of gross scapular strength to 4+/5    Baseline 4-/5    Time 8    Period Weeks    Status New    Target Date 06/09/21      PT LONG TERM GOAL #3   Title The patient report reduction of headaches to 1x/week for less.    Baseline constant    Time 8    Period Weeks    Status New    Target Date 06/09/21                   Plan - 04/28/21 1128     Clinical Impression Statement The patient reports no  headaches.  She states that the tightness from going to [place] has subsided.  She still has the generalized tightness.  She reports getting small headaches and soreness after the dry needling.  Continued with the dry needling again today.  The patient did get the twitch response.  Performed STM and SOR post the dry needling.  Progressed deep neck flexor strengthening with supine head lifts.  Continue therapy for reductio of neck tightness and headaches.    Examination-Activity Limitations Bend;Carry;Sit;Lift    Examination-Participation Restrictions Driving;Occupation;Cleaning;Meal Prep;[employer] Work    PT Treatment/Interventions ADLs/Self Care Home Management;Cryotherapy;Electrical Stimulation;Moist Heat;Traction;Ultrasound;Therapeutic exercise;Therapeutic activities;Patient/family education;Manual techniques;Dry needling;Aquatic Therapy;Functional mobility training;Neuromuscular re-education;Taping;Spinal Manipulations;Joint Manipulations    PT Next Visit Plan FOTOS for 6th visit, progress cervical /scapular strabilization, neck stretches, self subocciptal release, quadruped scapular/cervical stabilization exercises.    PT Home Exercise Plan Access Code: [email or web address]    Consulted and Agree with Plan of Care Patient             Patient will benefit from skilled therapeutic intervention in order to improve the following deficits and impairments:  Pain, Decreased activity tolerance, Decreased strength  Visit Diagnosis: Chronic migraine without aura without status migrainosus, not intractable  Cervical paraspinal muscle spasm  Muscle weakness (generalized)     Problem List 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 DN  Bethena Midget 04/28/2021, 11:46 AM  [place], [place],  [address] Phone: [phone]   Fax:  [phone]  Name: Amber Ware MRN: [id number] Date of Birth: 1981-05-23

## 2021-05-01 ENCOUNTER — Other Ambulatory Visit: Payer: Self-pay

## 2021-05-01 ENCOUNTER — Ambulatory Visit: Payer: No Typology Code available for payment source

## 2021-05-01 DIAGNOSIS — M62838 Other muscle spasm: Secondary | ICD-10-CM

## 2021-05-01 DIAGNOSIS — M6281 Muscle weakness (generalized): Secondary | ICD-10-CM

## 2021-05-01 DIAGNOSIS — G43709 Chronic migraine without aura, not intractable, without status migrainosus: Secondary | ICD-10-CM

## 2021-05-01 NOTE — Therapy (Signed)
[place], [place], [address] Phone: [phone]   Fax:  [phone]  Physical Therapy Treatment  Patient Details  Name: Amber Ware MRN: [id number] Date of Birth: Sep 09, 1981 Referring Provider (PT): Flossie Buffy   Encounter Date: 05/01/2021   PT End of Session - 05/01/21 1541     Visit Number 6    Number of Visits 17    Date for PT Re-Evaluation 06/09/21    Authorization Type [employer]- Needs Medical review after 25th visit    Authorization Time Period 6th visit FOTO, 10th visit FOTO    PT Start Time 1542    PT Stop Time 1630    PT Time Calculation (min) 48 min    Activity Tolerance Patient tolerated treatment well;No increased pain    Behavior During Therapy WFL for tasks assessed/performed             Past Medical History:  Diagnosis Date   Abnormal Pap smear of cervix 2014   Polycystic ovarian syndrome     Past Surgical History:  Procedure Laterality Date   CERVICAL BIOPSY  W/ LOOP ELECTRODE EXCISION  2014   COLPOSCOPY  2014    There were no vitals filed for this visit.   Subjective Assessment - 05/01/21 1546     Subjective The patient reports that she is doing better.  The scapular pain subsided this week.  The past 1.5 days the pain has been much better.  She reports no headaches in the last week.    Limitations Lifting;Sitting;House hold activities;Other (comment)    Patient Stated Goals To get rid of the constant pain/tension and headaches    Currently in Pain? No/denies                [facility] PT Assessment - 05/01/21 0001       Assessment   Medical Diagnosis neck pain    Referring Provider (PT) Nche, Charlene Brooke      Observation/Other Assessments   Focus on Therapeutic Outcomes (FOTO)  neck 59%      AROM   Cervical Flexion 60 deg    Cervical Extension 65 deg    Cervical - Right Side Bend 40 deg    Cervical - Left Side Bend 40 deg    Cervical - Right  Rotation 70 deg    Cervical - Left Rotation 75 deg                           [facility] Adult PT Treatment/Exercise - 05/01/21 0001       Neck Exercises: Supine   Capital Flexion 15 reps    Capital Flexion Limitations with pillow      Lumbar Exercises: Quadruped   Single Arm Raise 10 reps;Left;Right    Single Arm Raises Limitations with head rotation contralaterally    Opposite Arm/Leg Raise 10 reps;Left arm/Right leg;Right arm/Left leg      Shoulder Exercises: Prone   Other Prone Exercises Quadruped anterior weight shifts x 15   maintain Neck in neutral     Manual Therapy   Manual therapy comments Manual palpation and assessment during FDN    Joint Mobilization C0-1 distraction grade II    Soft tissue mobilization SOR, TrP release B levator, STM B cervical para spinals.    Manual Traction grade II cervical traction supine              Trigger Point Dry Needling -  05/01/21 0001     Consent Given? Yes    Other Dry Needling manual palpation and assessment during FDN    Upper Trapezius Response Twitch reponse elicited    Suboccipitals Response Palpable increased muscle length    Levator Scapulae Response Twitch response elicited    Cervical multifidi Response Palpable increased muscle length                  PT Education - 05/01/21 1641     Education Details updated HEP    Person(s) Educated Patient    Methods Explanation;Handout    Comprehension Verbalized understanding              PT Short Term Goals - 04/21/21 1153       PT SHORT TERM GOAL #1   Title The patient will be independent with a HEP of stabilization and stretches    Period Weeks    Status Achieved    Target Date 04/28/21      PT SHORT TERM GOAL #2   Title The patient will report reduction of headaches from constant to intermittent    Baseline no headaches over last 2 days: 04/21/21    Time 2    Period Weeks    Status Achieved               PT Long Term Goals -  05/01/21 1545       PT LONG TERM GOAL #1   Title The patient will score a 61% or better on FOTO    Baseline 46%   (6/30: 59%)    Time 8    Period Weeks    Status Partially Met      PT LONG TERM GOAL #2   Title The patient will present with improvement of gross scapular strength to 4+/5    Baseline 4-/5    Time 8    Period Weeks    Status On-going      PT LONG TERM GOAL #3   Title The patient report reduction of headaches to 1x/week for less.    Baseline constant    Time 8    Period Weeks    Status Partially Met      PT LONG TERM GOAL #4   Status Achieved                   Plan - 05/01/21 1642     Clinical Impression Statement Amber Ware has attended 6 sessions of physical therapy.  She reports no headaches over the last week.  She presents with improvement of neck range of motion into all directions within functional limits.  She did have a slight pinching at [facility] of right cervical rotation motion.  Continued with dry needling today and STM post the dry needling.  Cervical/scapular stabilization exercises were progressed with quadruped UE lifts with contralateral neck rotation.  The patient's HEP was progressed today as well.  The patient had improvement of her FOTO score to 59% versus 46% at the initial session.  Recommend continued therapy for stabilization and strengthening to reduce headaches.    Examination-Activity Limitations Bend;Carry;Sit;Lift    Examination-Participation Restrictions Driving;Occupation;Cleaning;Meal Prep;[employer] Work    PT Treatment/Interventions ADLs/Self Care Home Management;Cryotherapy;Electrical Stimulation;Moist Heat;Traction;Ultrasound;Therapeutic exercise;Therapeutic activities;Patient/family education;Manual techniques;Dry needling;Aquatic Therapy;Functional mobility training;Neuromuscular re-education;Taping;Spinal Manipulations;Joint Manipulations    PT Next Visit Plan progress cervical /scapular strabilization, neck stretches, self  subocciptal release, quadruped scapular/cervical stabilization exercises.    PT Home Exercise Plan Access Code: [email or web address]  Consulted and Agree with Plan of Care Patient             Patient will benefit from skilled therapeutic intervention in order to improve the following deficits and impairments:  Pain, Decreased activity tolerance, Decreased strength  Visit Diagnosis: Chronic migraine without aura without status migrainosus, not intractable  Cervical paraspinal muscle spasm  Muscle weakness (generalized)     Problem List 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 DN  Bethena Midget 05/01/2021, 4:48 PM  [facility] [address] [place], [place], [address] Phone: [phone]   Fax:  [phone]  Name: Amber Ware MRN: [id number] Date of Birth: 23-Nov-1980

## 2021-05-01 NOTE — Patient Instructions (Signed)
Access Code: [email or web address] URL: [email or web address] Date: 05/01/2021 Prepared by: Rich Number  Exercises Supine Deep Neck Flexor Training - 2 x daily - 7 x weekly - 1 sets - 15 reps - 5 hold Shoulder External Rotation and Scapular Retraction with Resistance - 2 x daily - 7 x weekly - 1 sets - 15 reps - 5 hold Supine Suboccipital Release with Tennis Balls - 1 x daily - 7 x weekly - 3 sets - 10 reps Supine Mid-Thoracic Mobilization - 1 x daily - 7 x weekly - 3 sets - 10 reps Standing Upper Trapezius Stretch - 1 x daily - 7 x weekly - 1 sets - 3 reps - 20-30 hold Gentle Levator Scapulae Stretch - 1 x daily - 7 x weekly - 1 sets - 3 reps - 20-30 hold Bird Dog - 1 x daily - 7 x weekly - 2 sets - 10 reps - 5 hold Quadruped Neck Rotation with Opposite Shoulder Horizontal Abduction - 1 x daily - 7 x weekly - 1 sets - 10 reps - 3-5 hold Supine Segmental Cervical Flexion - 1 x daily - 7 x weekly - 1 sets - 15 reps

## 2021-05-08 ENCOUNTER — Encounter: Payer: No Typology Code available for payment source | Admitting: Physical Therapy

## 2021-05-12 ENCOUNTER — Ambulatory Visit: Payer: No Typology Code available for payment source | Attending: Nurse Practitioner | Admitting: Physical Therapy

## 2021-05-12 ENCOUNTER — Other Ambulatory Visit: Payer: Self-pay

## 2021-05-12 ENCOUNTER — Encounter: Payer: Self-pay | Admitting: Physical Therapy

## 2021-05-12 DIAGNOSIS — M6281 Muscle weakness (generalized): Secondary | ICD-10-CM | POA: Insufficient documentation

## 2021-05-12 DIAGNOSIS — G43709 Chronic migraine without aura, not intractable, without status migrainosus: Secondary | ICD-10-CM | POA: Diagnosis not present

## 2021-05-12 DIAGNOSIS — M62838 Other muscle spasm: Secondary | ICD-10-CM | POA: Diagnosis not present

## 2021-05-12 NOTE — Therapy (Signed)
[place], [place], [address] Phone: [phone]   Fax:  [phone]  Physical Therapy Treatment  Patient Details  Name: Amber Ware MRN: [id number] Date of Birth: [birth year] Referring Provider (PT): Flossie Buffy   Encounter Date: 05/12/2021   PT End of Session - 05/12/21 1157     Visit Number 7    Number of Visits 17    Date for PT Re-Evaluation 06/09/21    Authorization Type [employer]- Needs Medical review after 25th visit    Authorization Time Period 6th visit FOTO, 10th visit FOTO    PT Start Time 1151    PT Stop Time 1230    PT Time Calculation (min) 39 min             Past Medical History:  Diagnosis Date   Abnormal Pap smear of cervix 2014   Polycystic ovarian syndrome     Past Surgical History:  Procedure Laterality Date   CERVICAL BIOPSY  W/ LOOP ELECTRODE EXCISION  2014   COLPOSCOPY  2014    There were no vitals filed for this visit.   Subjective Assessment - 05/12/21 1153     Subjective I was doing good for a week without having physical therapy, then sunday I woke up in alot of pain in upper traps but it got better after taking a muscle relaxer and I was okay to go to work.    Currently in Pain? No/denies   a little stiffness, pulling with rotation of neck.                              [facility] Adult PT Treatment/Exercise - 05/12/21 0001       Self-Care   Self-Care Posture    Posture use of towel roll at neck in sidelying to prevent upper trap shortening      Neck Exercises: Theraband   Shoulder Extension 20 reps    Shoulder Extension Limitations green    Rows 20 reps    Shoulder External Rotation 20 reps    Shoulder External Rotation Limitations standing  5 sec hold    Horizontal ABduction 20 reps    Horizontal ABduction Limitations green standing      Neck Exercises: Supine   Capital Flexion 15 reps    Capital Flexion Limitations with  pillow      Lumbar Exercises: Quadruped   Single Arm Raise 10 reps;Left;Right    Single Arm Raises Limitations with head rotation contralaterally    Opposite Arm/Leg Raise 10 reps;Left arm/Right leg;Right arm/Left leg      Shoulder Exercises: ROM/Strengthening   UBE (Upper Arm Bike) L2 posterior x 3 minutes      Neck Exercises: Stretches   Upper Trapezius Stretch 30 seconds;2 reps    Levator Stretch 30 seconds;2 reps    Other Neck Stretches open books x 10 each way                      PT Short Term Goals - 04/21/21 1153       PT SHORT TERM GOAL #1   Title The patient will be independent with a HEP of stabilization and stretches    Period Weeks    Status Achieved    Target Date 04/28/21      PT SHORT TERM GOAL #2   Title The patient will report reduction of headaches from constant to  intermittent    Baseline no headaches over last 2 days: 04/21/21    Time 2    Period Weeks    Status Achieved               PT Long Term Goals - 05/01/21 1545       PT LONG TERM GOAL #1   Title The patient will score a 61% or better on FOTO    Baseline 46%   (6/30: 59%)    Time 8    Period Weeks    Status Partially Met      PT LONG TERM GOAL #2   Title The patient will present with improvement of gross scapular strength to 4+/5    Baseline 4-/5    Time 8    Period Weeks    Status On-going      PT LONG TERM GOAL #3   Title The patient report reduction of headaches to 1x/week for less.    Baseline constant    Time 8    Period Weeks    Status Partially Met      PT LONG TERM GOAL #4   Status Achieved                   Plan - 05/12/21 1251     Clinical Impression Statement Pt reports continued overall improvement with only 1 episode of pain in the morning in the past week. Continued with neck and scap stabilization and ROM. Self care provided including education on use of towel roll at neck in supine and sidelying to support neck and prevent shoulder  hike while sleeping, especially in sidelying. She reported that this position felt helpful. no increased pain with session today.    PT Next Visit Plan progress cervical /scapular strabilization, neck stretches, self subocciptal release, quadruped scapular/cervical stabilization exercises.    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 [place], [place], [address] Phone: [phone]   Fax:  [phone]  Name: Amber Ware MRN: [id number] Date of Birth: 07-Dec-1980

## 2021-05-15 ENCOUNTER — Ambulatory Visit: Payer: No Typology Code available for payment source

## 2021-05-15 ENCOUNTER — Other Ambulatory Visit: Payer: Self-pay

## 2021-05-15 DIAGNOSIS — M62838 Other muscle spasm: Secondary | ICD-10-CM

## 2021-05-15 DIAGNOSIS — M6281 Muscle weakness (generalized): Secondary | ICD-10-CM

## 2021-05-15 DIAGNOSIS — G43709 Chronic migraine without aura, not intractable, without status migrainosus: Secondary | ICD-10-CM | POA: Diagnosis not present

## 2021-05-15 NOTE — Therapy (Addendum)
[place], [place], [address] Phone: [phone]   Fax:  [phone]  Physical Therapy Treatment / Discharge  Patient Details  Name: Amber Ware MRN: [id number] Date of Birth: [birth year] Referring Provider (PT): Nche, Bassett THERAPY DISCHARGE SUMMARY  Visits from Start of Care: 04/14/21  Current functional level related to goals / functional outcomes: unable to assess due to non return   Remaining deficits: Unable to assess due to non return   Education / Equipment: HEP   Patient agrees to discharge. Patient goals were partially met. Patient is being discharged due to not returning since the last visit.   Encounter Date: 05/15/2021   PT End of Session - 05/15/21 1626     Visit Number 8    Number of Visits 17    Date for PT Re-Evaluation 06/09/21    Authorization Type [employer]- Needs Medical review after 25th visit    Authorization Time Period 6th visit FOTO, 10th visit FOTO    PT Start Time 1626    PT Stop Time 1712    PT Time Calculation (min) 46 min    Activity Tolerance Patient tolerated treatment well;No increased pain    Behavior During Therapy WFL for tasks assessed/performed             Past Medical History:  Diagnosis Date   Abnormal Pap smear of cervix 2014   Polycystic ovarian syndrome     Past Surgical History:  Procedure Laterality Date   CERVICAL BIOPSY  W/ LOOP ELECTRODE EXCISION  2014   COLPOSCOPY  2014    There were no vitals filed for this visit.   Subjective Assessment - 05/15/21 1628     Subjective The patient reports that she was unable to come the week of the 4th due to needing to work.  She was able to last the whole week without headaches and then she had one on the Sunday.  She needed to take a muscle relaxer.  It slowly got better and she feels now that she is fighting a headache and one wants to come on.                [facility] PT  Assessment - 05/15/21 0001       Assessment   Medical Diagnosis neck pain    Referring Provider (PT) Nche, Charlotte Lum      AROM   Cervical Flexion 55    Cervical Extension 60    Cervical - Right Rotation 65    Cervical - Left Rotation 65                           [facility] Adult PT Treatment/Exercise - 05/15/21 0001       Neck Exercises: Theraband   Shoulder External Rotation 20 reps    Shoulder External Rotation Limitations standing  5 sec hold    Horizontal ABduction 20 reps    Horizontal ABduction Limitations green standing      Neck Exercises: Standing   Upper Extremity D2 Flexion;10 reps;Theraband    Theraband Level (UE D2) Level 3 (Green)      Neck Exercises: Seated   Cervical Rotation Both;15 reps    Cervical Rotation Limitations in flexion      Neck Exercises: Supine   Capital Flexion 20 reps    Capital Flexion Limitations with pillow      Lumbar Exercises: Quadruped   Single  Arm Raise 10 reps;Left;Right    Single Arm Raises Limitations with head rotation contralaterally    Other Quadruped Lumbar Exercises qudruped anterior weight shift x 15      Manual Therapy   Manual therapy comments Manual palpation and assessment during FDN    Joint Mobilization C0-1 distraction grade II    Soft tissue mobilization SOR, TrP release B levator, STM B cervical para spinals.    Manual Traction grade II cervical traction supine              Trigger Point Dry Needling - 05/15/21 0001     Consent Given? Yes    Other Dry Needling manual palpation and assessment during FDN    Upper Trapezius Response Twitch reponse elicited    Suboccipitals Response Palpable increased muscle length    Levator Scapulae Response Twitch response elicited    Cervical multifidi Response Palpable increased muscle length                    PT Short Term Goals - 04/21/21 1153       PT SHORT TERM GOAL #1   Title The patient will be independent with a HEP of  stabilization and stretches    Period Weeks    Status Achieved    Target Date 04/28/21      PT SHORT TERM GOAL #2   Title The patient will report reduction of headaches from constant to intermittent    Baseline no headaches over last 2 days: 04/21/21    Time 2    Period Weeks    Status Achieved               PT Long Term Goals - 05/15/21 1716       PT LONG TERM GOAL #1   Title The patient will score a 61% or better on FOTO    Baseline 46%   (6/30: 59%)    Time 8    Period Weeks    Status Partially Met      PT LONG TERM GOAL #2   Title The patient will present with improvement of gross scapular strength to 4+/5    Baseline 4-/5    Time 8    Period Weeks    Status On-going      PT LONG TERM GOAL #3   Title The patient report reduction of headaches to 1x/week for less.    Baseline constant    Time 8    Period Weeks    Status Partially Met      PT LONG TERM GOAL #4   Status Achieved                   Plan - 05/15/21 1704     Clinical Impression Statement Amber Ware reports that she been having increase of tightness in the neck that it seems like she is about to get an headache, but she has not gotten one.  It has been 1 week since her last headache and she had to take medication.  It was very severe at the moment.  We will reduce therapy to 1x/week at this time.  The patient did have a slight reduction of neck motion today.  Continued with dry needling, STM, and scpular/cervical stabilization.  The patient tolerated therapy well.    Examination-Activity Limitations Bend;Carry;Sit;Lift    Examination-Participation Restrictions Driving;Occupation;Cleaning;Meal Prep;[employer] Work    PT Treatment/Interventions ADLs/Self Care Home Management;Cryotherapy;Electrical Stimulation;Moist Heat;Traction;Ultrasound;Therapeutic exercise;Therapeutic activities;Patient/family education;Manual techniques;Dry needling;Aquatic Therapy;Functional mobility  training;Neuromuscular  re-education;Taping;Spinal Manipulations;Joint Manipulations    PT Next Visit Plan Reduce frequency to 1x/week, progress cervical /scapular strabilization prone on SB, neck stretches, self subocciptal release, quadruped scapular/cervical stabilization exercises.    PT Home Exercise Plan Access Code: [email or web address]    Consulted and Agree with Plan of Care Patient             Patient will benefit from skilled therapeutic intervention in order to improve the following deficits and impairments:  Pain, Decreased activity tolerance, Decreased strength, Obesity  Visit Diagnosis: Chronic migraine without aura without status migrainosus, not intractable  Muscle weakness (generalized)  Cervical paraspinal muscle spasm     Problem List 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 DN Bethena Midget 05/15/2021, 5:17 PM  06/04/21:  The patient will be discharged from therapy due to non-return and cancelling all her remaining physical therapy appointments.  Unable to re-assess due to non-return Rich Number, PT, DPT, OCS, Crt. DN 06/04/2021, 3:34 PM   [place], [place], [address] Phone: [phone]   Fax:  [phone]  Name: Amber Ware MRN: [id number] Date of Birth: Mar 03, 1981

## 2021-05-21 ENCOUNTER — Encounter: Payer: No Typology Code available for payment source | Admitting: Physical Therapy

## 2021-05-23 ENCOUNTER — Ambulatory Visit: Payer: No Typology Code available for payment source

## 2021-05-30 ENCOUNTER — Telehealth: Payer: Self-pay

## 2021-05-30 ENCOUNTER — Ambulatory Visit: Payer: No Typology Code available for payment source

## 2021-05-30 NOTE — Telephone Encounter (Signed)
Amber Ware cancelled her remaining appointments.  Left message regarding her need for continued care or if she wishes to be discharged.  [name], PT, DPT, OCS, Crt. DN

## 2021-06-02 IMAGING — CT CT ABD-PELV W/ CM
2 of 4 series · 15 of 46 positions shown, 17 images · IV contrast (OMNIPAQUE 300)
Comparison: Abdominal ultrasound performed the same day

CLINICAL DATA: Abdominal mass

EXAM:
CT ABDOMEN AND PELVIS WITH CONTRAST
TECHNIQUE: Multidetector CT imaging of the abdomen and pelvis was performed
using the standard protocol following bolus administration of
intravenous contrast.
CONTRAST:  100mL OMNIPAQUE IOHEXOL 300 MG/ML  SOLN

[Series 2: abd/pel w · axial · 0.69mm/px · z∈[-467,-47]mm · 12 of 96 slices shown, 14 images]
[im 8/96  soft-tissue]
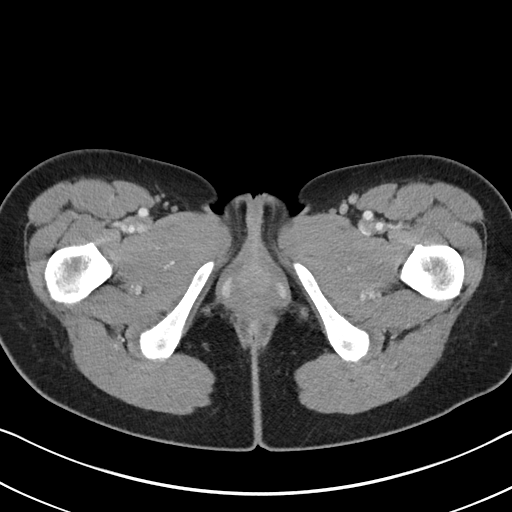
[im 8/96  bone]
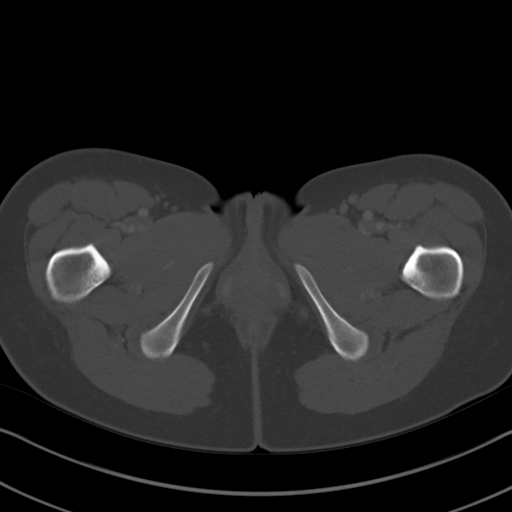
[im 16/96  soft-tissue]
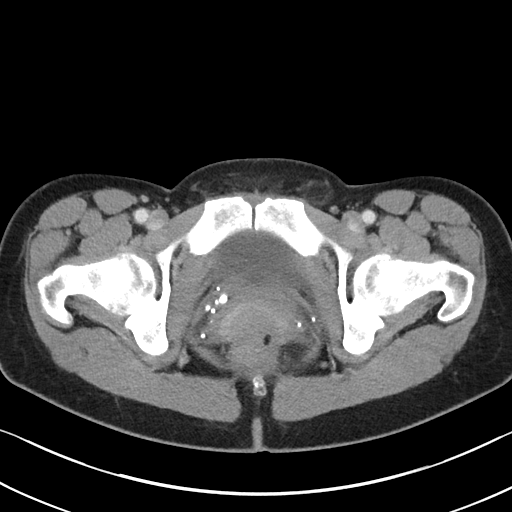
[im 23/96  soft-tissue]
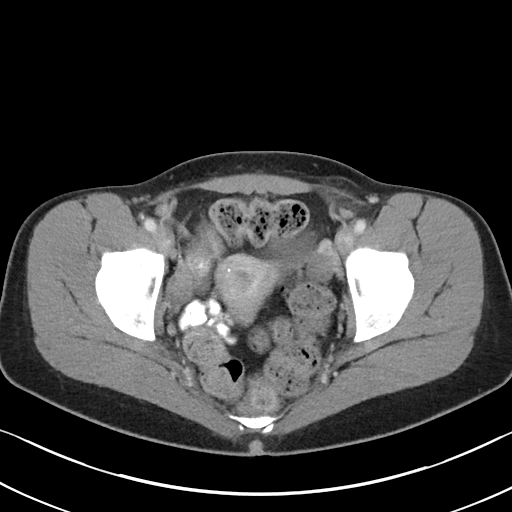
[im 31/96  soft-tissue]
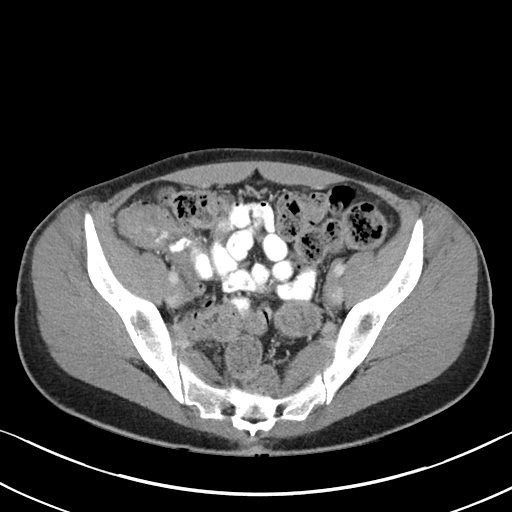
[im 39/96  soft-tissue]
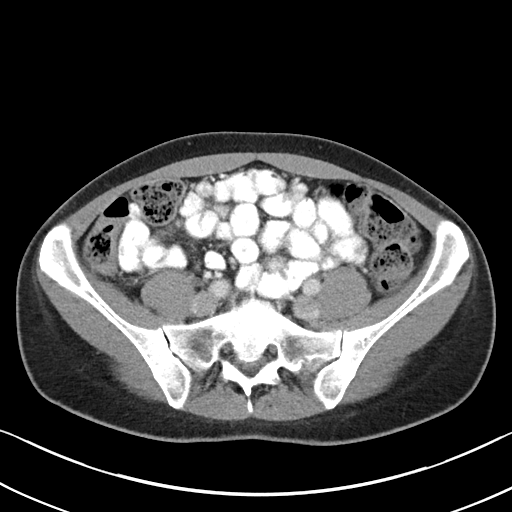
[im 46/96  soft-tissue]
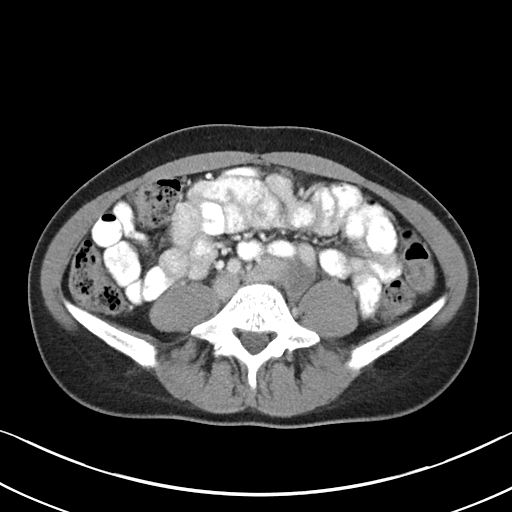
[im 54/96  soft-tissue]
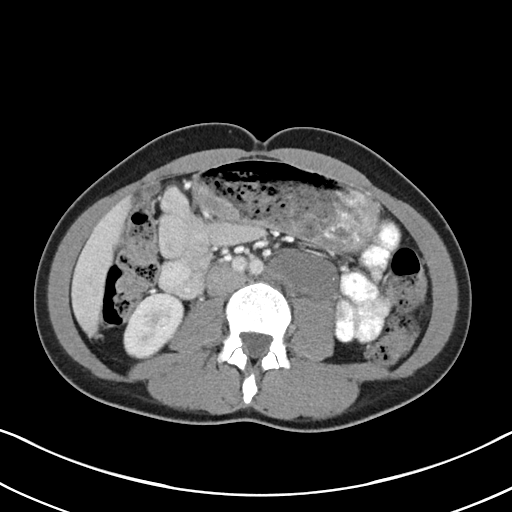
[im 61/96  soft-tissue]
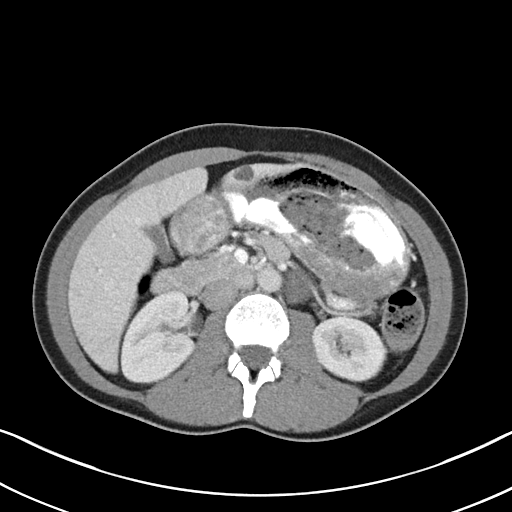
[im 69/96  soft-tissue]
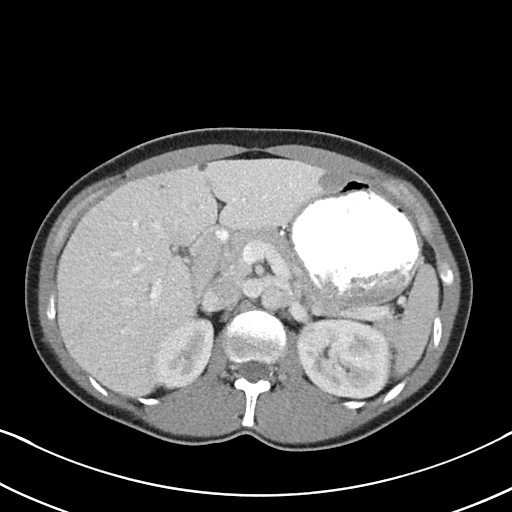
[im 69/96  bone]
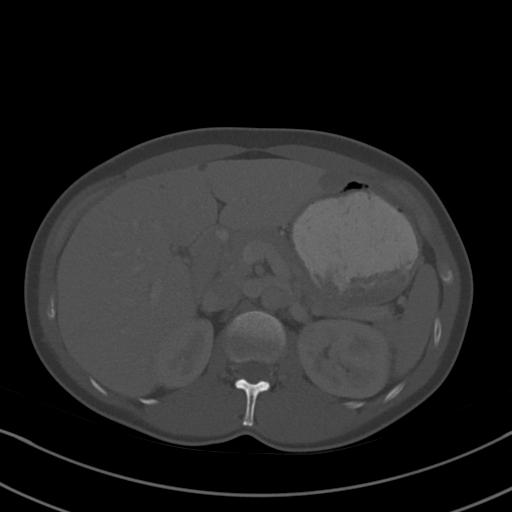
[im 77/96  soft-tissue]
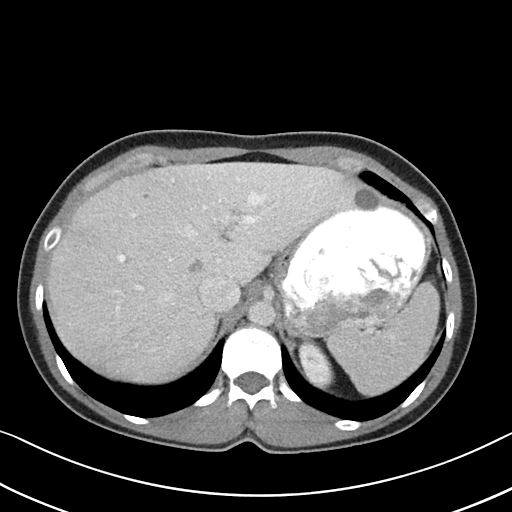
[im 84/96  soft-tissue]
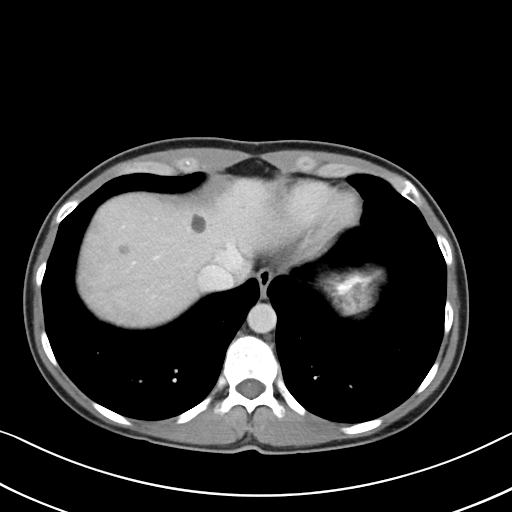
[im 92/96  soft-tissue]
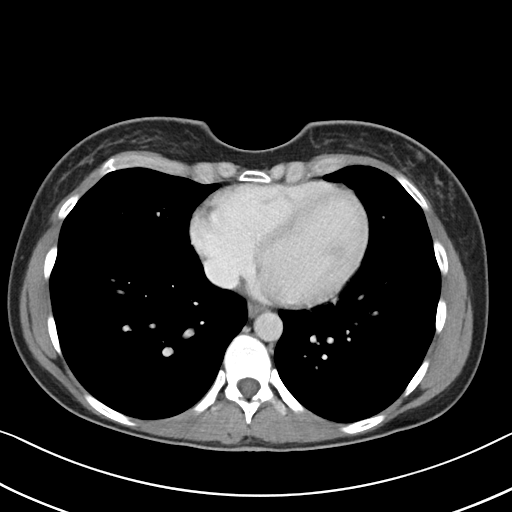

[Series 5: abd/pel w st · coronal · 0.62mm/px · 3 of 68 slices shown]
[im 23/68  soft-tissue]
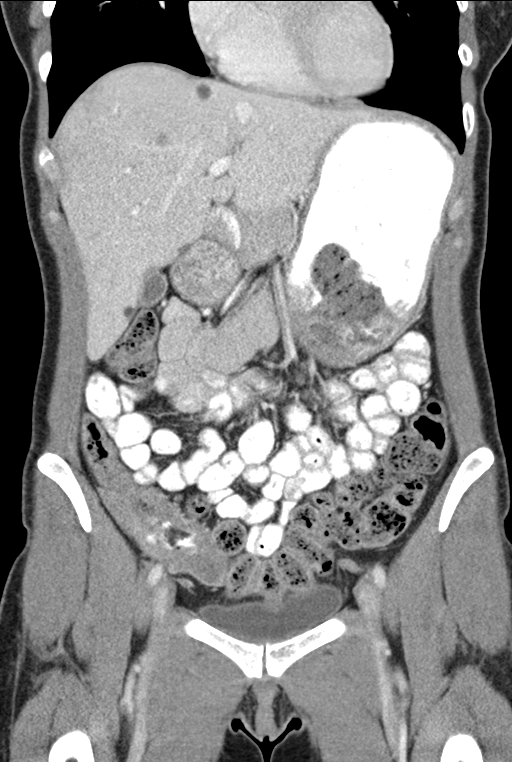
[im 30/68  soft-tissue]
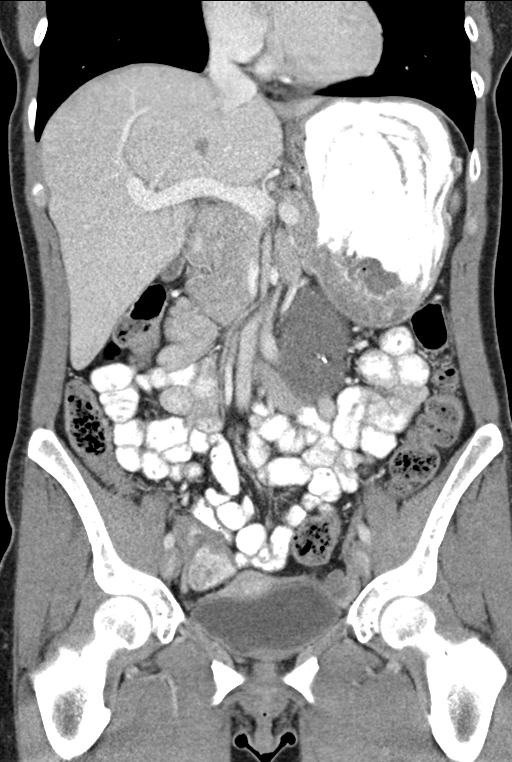
[im 38/68  soft-tissue]
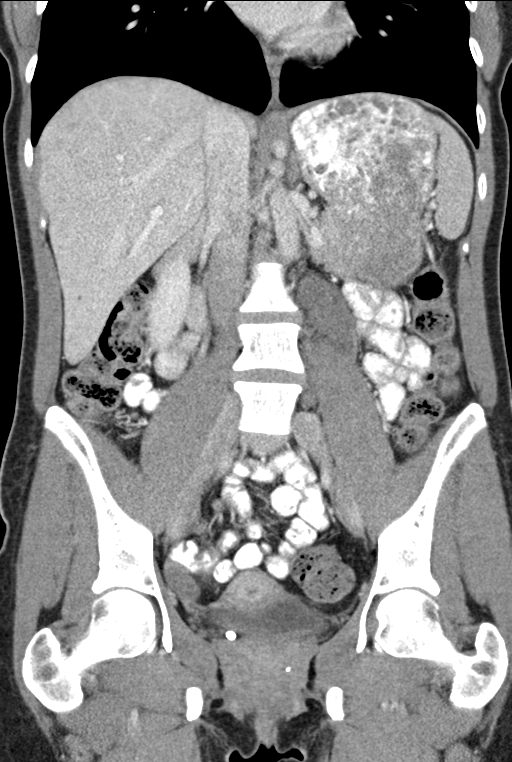

[15 of 46 positions shown; findings below may reference images not displayed]

FINDINGS: Lower chest: No acute abnormality.

Hepatobiliary: Numerous hypoechoic masses are seen throughout the
liver measuring up to 1.6 cm in greatest dimension. There is also a
cyst in the dome of the liver measuring 1.4 cm (series 2, image 13)
no gallstones, gallbladder wall thickening, or biliary dilatation.

Pancreas: Unremarkable. No pancreatic ductal dilatation or
surrounding inflammatory changes.

Spleen: Normal in size without focal abnormality.

Adrenals/Urinary Tract: Adrenal glands are unremarkable. Kidneys are
normal, without renal calculi, focal lesion, or hydronephrosis.
Bladder is unremarkable.

Stomach/Bowel: Stomach is within normal limits. Appendix appears
normal. No evidence of bowel wall thickening, distention, or
inflammatory changes.

Vascular/Lymphatic: No significant vascular findings are present. No
enlarged pelvic lymph nodes.

Reproductive: Uterus and bilateral adnexa are unremarkable.

Other: A retroperitoneal soft tissue mass with internal
calcifications along the left aspect of the abdominal aorta
measuring 8.9 (series 6, image 60) x 4.9 x 2.9 cm (series 2, image
42) is likely located in the left pararenal space. There is no fat
density within the mass. No abdominal wall hernia or abnormality. No
abdominopelvic ascites.

Musculoskeletal: No acute or significant osseous findings.
IMPRESSION: 1. Retroperitoneal soft tissue mass with internal calcifications
along the left aspect of the abdominal aorta, measuring 8.9 x 4.9 x
2.9 cm. No definite fat density is identified within the mass.
Differential considerations include a soft tissue sarcoma, a
teratoma without significant fat or cystic component, and
extragastrointestinal stromal tumor.
2. Numerous hypoechoic masses throughout the liver, measuring up to
1.6 cm in greatest dimension are suspicious for metastases.

These results will be called to the ordering clinician or
representative by the Radiologist Assistant, and communication
documented in the PACS or zVision Dashboard.

## 2021-06-04 ENCOUNTER — Encounter: Payer: No Typology Code available for payment source | Admitting: Physical Therapy

## 2021-07-01 ENCOUNTER — Encounter: Payer: No Typology Code available for payment source | Admitting: Nurse Practitioner

## 2021-07-22 DIAGNOSIS — M542 Cervicalgia: Secondary | ICD-10-CM | POA: Diagnosis not present

## 2021-07-22 DIAGNOSIS — M5412 Radiculopathy, cervical region: Secondary | ICD-10-CM | POA: Diagnosis not present

## 2021-10-07 ENCOUNTER — Ambulatory Visit (HOSPITAL_BASED_OUTPATIENT_CLINIC_OR_DEPARTMENT_OTHER): Payer: No Typology Code available for payment source | Admitting: Orthopaedic Surgery

## 2021-10-13 ENCOUNTER — Other Ambulatory Visit: Payer: Self-pay

## 2021-10-13 DIAGNOSIS — Z3041 Encounter for surveillance of contraceptive pills: Secondary | ICD-10-CM

## 2021-10-13 MED ORDER — NORGESTIM-ETH ESTRAD TRIPHASIC 0.18/0.215/0.25 MG-25 MCG PO TABS: 1.0000 | ORAL_TABLET | Freq: Every day | ORAL | 0 refills | Status: DC

## 2021-10-13 NOTE — Telephone Encounter (Signed)
Last AEX 11/14/20 with Karma Ganja, NP AEX scheduled 11/17/2021 with Marny Lowenstein, NP  Patient said she will need refill prior to visit.

## 2021-10-13 NOTE — Telephone Encounter (Signed)
Spoke with patient and informed her. °

## 2021-10-16 ENCOUNTER — Ambulatory Visit (HOSPITAL_BASED_OUTPATIENT_CLINIC_OR_DEPARTMENT_OTHER): Payer: No Typology Code available for payment source | Admitting: Orthopaedic Surgery

## 2021-10-23 ENCOUNTER — Ambulatory Visit (HOSPITAL_BASED_OUTPATIENT_CLINIC_OR_DEPARTMENT_OTHER): Payer: No Typology Code available for payment source | Admitting: Orthopaedic Surgery

## 2021-10-23 ENCOUNTER — Other Ambulatory Visit (HOSPITAL_BASED_OUTPATIENT_CLINIC_OR_DEPARTMENT_OTHER): Payer: Self-pay | Admitting: Orthopaedic Surgery

## 2021-10-23 ENCOUNTER — Other Ambulatory Visit (HOSPITAL_BASED_OUTPATIENT_CLINIC_OR_DEPARTMENT_OTHER): Payer: Self-pay

## 2021-10-23 ENCOUNTER — Other Ambulatory Visit: Payer: Self-pay

## 2021-10-23 ENCOUNTER — Ambulatory Visit (HOSPITAL_BASED_OUTPATIENT_CLINIC_OR_DEPARTMENT_OTHER)
Admission: RE | Admit: 2021-10-23 | Discharge: 2021-10-23 | Disposition: A | Discharge status: Home / Self Care | Payer: No Typology Code available for payment source | Source: Ambulatory Visit | Attending: Orthopaedic Surgery | Admitting: Orthopaedic Surgery

## 2021-10-23 DIAGNOSIS — M25511 Pain in right shoulder: Secondary | ICD-10-CM | POA: Insufficient documentation

## 2021-10-23 DIAGNOSIS — G2589 Other specified extrapyramidal and movement disorders: Secondary | ICD-10-CM

## 2021-10-23 DIAGNOSIS — G8929 Other chronic pain: Secondary | ICD-10-CM

## 2021-10-23 MED ORDER — METHOCARBAMOL 500 MG PO TABS
500.0000 mg | ORAL_TABLET | Freq: Four times a day (QID) | ORAL | 2 refills | Status: DC
  Filled 2021-10-23: qty 30, 8d supply, fill #0

## 2021-10-23 NOTE — Progress Notes (Signed)
Chief Complaint: right shoulder pain     History of Present Illness:    Amber Ware is a 40 y.o. female left hand dominant presents with bilateral although right worse than left shoulder pain now going on for several years.  These will occasionally flareup and get sore as though she felt like she was sleeping on it wrong.  She has tried acupuncture as well as coming massage and physical therapy in the past this was not effective.  She also endorses a protruding bone about the right AC joint area.  She is taken over-the-counter pain medication and balm's which have not been relieving.  She does not like the sedating nature of muscle relaxers.  She works Theatre manager in the produce department    Surgical History:   None  PMH/PSH/Family History/Social History/Meds/Allergies:    Past Medical History:  Diagnosis Date   Abnormal Pap smear of cervix 2014   Polycystic ovarian syndrome    Past Surgical History:  Procedure Laterality Date   CERVICAL BIOPSY  W/ LOOP ELECTRODE EXCISION  2014   COLPOSCOPY  2014   Social History   Socioeconomic History   Marital status: Married    Spouse name: Not on file   Number of children: Not on file   Years of education: Not on file   Highest education level: Not on file  Occupational History   Not on file  Tobacco Use   Smoking status: Former   Smokeless tobacco: Never  Vaping Use   Vaping Use: Never used  Substance and Sexual Activity   Alcohol use: No   Drug use: No   Sexual activity: Yes    Partners: Male    Birth control/protection: OCP  Other Topics Concern   Not on file  Social History Narrative   Not on file   Social Determinants of Health   Financial Resource Strain: Not on file  Food Insecurity: Not on file  Transportation Needs: Not on file  Physical Activity: Not on file  Stress: Not on file  Social Connections: Not on file   Family History  Problem Relation  Age of Onset   Breast cancer Mother 60       still alive   Thyroid disease Mother    Hypertension Father    Breast cancer Maternal Aunt 76   Breast cancer Maternal Aunt 57   No Known Allergies Current Outpatient Medications  Medication Sig Dispense Refill   cyclobenzaprine (FLEXERIL) 5 MG tablet Take 1-2 tablets (5-10 mg total) by mouth at bedtime as needed for muscle spasms. 30 tablet 0   metroNIDAZOLE (FLAGYL) 500 MG tablet Take 1 tablet (500 mg total) by mouth 2 (two) times daily. (Patient not taking: Reported on 04/14/2021) 14 tablet 0   Norgestimate-Ethinyl Estradiol Triphasic (ORTHO TRI-CYCLEN LO) 0.18/0.215/0.25 MG-25 MCG tab Take 1 tablet by mouth daily. 84 tablet 0   SUMAtriptan (IMITREX) 25 MG tablet TAKE 1 TABLET EVERY 2 (TWO) HOURS AS NEEDED FOR MIGRAINE. NO MORE THAN 100MG  IN 24HRS 10 tablet 0   No current facility-administered medications for this visit.   No results found.  Review of Systems:   A ROS was performed including pertinent positives and negatives as documented in the HPI.  Physical Exam :   Constitutional: NAD and appears stated age Neurological:  Alert and oriented Psych: Appropriate affect and cooperative There were no vitals taken for this visit.   Comprehensive Musculoskeletal Exam:    Musculoskeletal Exam    Inspection Right Left  Skin No atrophy or winging No atrophy or winging  Palpation    Tenderness Upper trap right subscapular Upper trap, medial scapula  Range of Motion    Flexion (passive) 170 170  Flexion (active) 170 170  Abduction 170 170  ER at the side 70 70  Can reach behind back to T12 T12  Strength     Full Full  Special Tests    Pseudoparalytic No No  Neurologic    Fires PIN, radial, median, ulnar, musculocutaneous, axillary, suprascapular, long thoracic, and spinal accessory innervated muscles. No abnormal sensibility  Vascular/Lymphatic    Radial Pulse 2+ 2+  Cervical Exam    Patient has symmetric cervical range of  motion with negative Spurling's test.  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  She does baseline have several signs of ligamentous laxity.  I described that ultimately this is likely contributed to her scapular dyskinesis and winging.  As result I would recommend physical therapy at this time specifically for a scapular program.  I have advised that these types of exercises should be routine and a part of her weekly life in order to optimize her scapular motion.  I described that ultimately this should take priority in terms of rehabilitation although I would also recommend a shoulder strengthening and rotator cuff program after her scapula dyskinesis has improved.  I do believe that she would also be a candidate for trapezial needling at this time.  She was also advised on a figure-of-eight shoulder brace to improve with posture in order to keep her scapula back.  Plan :    -She will return to clinic as needed     I personally saw and evaluated the patient, and participated in the management and treatment plan.  Vanetta Mulders, MD Attending Physician, Orthopedic Surgery  This document was dictated using Dragon voice recognition software. A reasonable attempt at proof reading has been made to minimize errors.

## 2021-11-17 ENCOUNTER — Ambulatory Visit: Payer: BC Managed Care – PPO | Admitting: Nurse Practitioner

## 2021-11-17 ENCOUNTER — Ambulatory Visit: Payer: No Typology Code available for payment source | Admitting: Nurse Practitioner

## 2021-11-17 NOTE — Progress Notes (Deleted)
° °  Amber Ware 10-24-1981 [id number]   History:  41 y.o. G0 presents for annual exam. Monthly cycles/OCPs. 2014 LEEP. History of PCOS.   Gynecologic History No LMP recorded.   Contraception/Family planning: OCP (estrogen/progesterone) Sexually active: ***  Health Maintenance Last Pap: 09/27/2018. Results were: Normal Last mammogram: Never Last colonoscopy: Not indicated Last Dexa: Not indicated  Past medical history, past surgical history, family history and social history were all reviewed and documented in the EPIC chart. Mother and 2 maternal aunts with history of breast cancer.   ROS:  A ROS was performed and pertinent positives and negatives are included.  Exam:  There were no vitals filed for this visit. There is no height or weight on file to calculate BMI.  General appearance:  Normal Thyroid:  Symmetrical, normal in size, without palpable masses or nodularity. Respiratory  Auscultation:  Clear without wheezing or rhonchi Cardiovascular  Auscultation:  Regular rate, without rubs, murmurs or gallops  Edema/varicosities:  Not grossly evident Abdominal  Soft,nontender, without masses, guarding or rebound.  Liver/spleen:  No organomegaly noted  Hernia:  None appreciated  Skin  Inspection:  Grossly normal Breasts: Examined lying and sitting.   Right: Without masses, retractions, nipple discharge or axillary adenopathy.   Left: Without masses, retractions, nipple discharge or axillary adenopathy. Genitourinary   Inguinal/mons:  Normal without inguinal adenopathy  External genitalia:  Normal appearing vulva with no masses, tenderness, or lesions  BUS/Urethra/Skene's glands:  Normal  Vagina:  Normal appearing with normal color and discharge, no lesions  Cervix:  Normal appearing without discharge or lesions  Uterus:  Normal in size, shape and contour.  Midline and mobile, nontender  Adnexa/parametria:     Rt: Normal in size, without masses or  tenderness.   Lt: Normal in size, without masses or tenderness.  Anus and perineum: Normal  Patient informed chaperone available to be present for breast and pelvic exam. Patient has requested no chaperone to be present. Patient has been advised what will be completed during breast and pelvic exam.   Assessment/Plan:  41 y.o. G0 for annual exam.    Return in 1 year for annual.   Amber Gammon DNP, 7:57 AM 11/17/2021

## 2021-11-19 ENCOUNTER — Encounter (HOSPITAL_BASED_OUTPATIENT_CLINIC_OR_DEPARTMENT_OTHER): Payer: Self-pay | Admitting: Physical Therapy

## 2021-11-19 ENCOUNTER — Ambulatory Visit (HOSPITAL_BASED_OUTPATIENT_CLINIC_OR_DEPARTMENT_OTHER): Payer: No Typology Code available for payment source | Attending: Orthopaedic Surgery | Admitting: Physical Therapy

## 2021-11-19 ENCOUNTER — Other Ambulatory Visit: Payer: Self-pay

## 2021-11-19 DIAGNOSIS — M6281 Muscle weakness (generalized): Secondary | ICD-10-CM | POA: Diagnosis not present

## 2021-11-19 DIAGNOSIS — G8929 Other chronic pain: Secondary | ICD-10-CM | POA: Diagnosis not present

## 2021-11-19 DIAGNOSIS — G43909 Migraine, unspecified, not intractable, without status migrainosus: Secondary | ICD-10-CM | POA: Diagnosis not present

## 2021-11-19 DIAGNOSIS — M25511 Pain in right shoulder: Secondary | ICD-10-CM | POA: Insufficient documentation

## 2021-11-19 NOTE — Therapy (Signed)
OUTPATIENT PHYSICAL THERAPY SHOULDER EVALUATION   Patient Name: Amber Ware MRN: [id number] DOB:April 15, 1981, 41 y.o., female Today's Date: 11/20/2021  PT End of Session - 11/19/21      Visit Number 1   Number of Visits 12    Date for PT Re-Evaluation 12/31/2021    Progress Note Due  PT Start Time                                                  0853                                                                         12/20/2021   PT Stop Time 0953    PT Time Calculation (min) 60 min    Equipment Utilized During Treatment    Activity Tolerance Patient tolerated treatment well    Behavior During Therapy [facility] for tasks assessed/performed       Past Medical History:  Diagnosis Date   Abnormal Pap smear of cervix 2014   Polycystic ovarian syndrome    Past Surgical History:  Procedure Laterality Date   CERVICAL BIOPSY  W/ LOOP ELECTRODE EXCISION  2014   COLPOSCOPY  2014   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   Liver masses 08/24/2019   Varicose veins of bilateral lower extremities with other complications 38/25/0539   Spider veins of limb 01/18/2018   Anxiety and depression 01/14/2018   Decreased sex drive 76/73/4193   Excessive hair growth 11/10/2016   Irregular menstrual cycle 11/10/2016    PCP: Flossie Buffy, NP  REFERRING PROVIDER: Vanetta Mulders, MD  REFERRING DIAG: [phone] (ICD-10-CM) - Chronic right shoulder pain    THERAPY DIAG:  Right shoulder pain, unspecified chronicity  Muscle weakness (generalized)   ONSET DATE: Chronic with exacerbation in September 2022  SUBJECTIVE:                                                                                                                                                                                       SUBJECTIVE STATEMENT: Pt reports having bilateral scapular pain R >  L on for several years.  She noticed it while she was doing Crossfit a few years ago and has now stopped Crossfit.  Pt reports her pain has progressively worsened over the past year with an exacerbation in September.  Pt was having neck pain and numbness in R UE also.  She has tried acupuncture which didn't provide relief.  Pt reports 1st bout of dry needling for cervical helped a lot but, the second time didn't help.  Pt received PT last year for cervical.  Pt saw ortho MD and he ordered x rays and PT.  MD note indicated right shoulder scapular dyskinesis likely in the setting of multidirectional instability, baseline has several signs of ligamentous laxity. MD order indicated Rhomboids and lower trapezius for scapular strengthening, candidate for dry needling.  MD also recommended a figure 8 brace for posture and postural position.  She wears it 1-3 hours per day and reports some improvement in sx's.   Pt reports she typically doesn't have pain while performing activities though has increased pain afterwards.  Pt able to perform her normal work activities without increased pain though may have pain afterwards.  Pt reports she is not limited with ADL/IADLs or reaching activities.  She states her pain "has no rhyme or reason".  Pt states she doesn't have specific activities that cause pain.  Pt has difficulty sleeping due to pain including pain with lying on R side. Pt has to limit her exercise activities including limiting her swimming and not playing golf due to pain.  Pt tried to play basketball and had significant pain afterwards.    PERTINENT HISTORY: Chronic pain, Neck pain, Chronic migraine/H/A's   PAIN:  Are you having pain? Yes NPRS scale: 4/10 current, 10/10 worst, 4/10 best Pain location: R medial scapula, UT PAIN TYPE: dull currently but can be throbbing, aching, sharp, burning when it's 7-10 Pain description: constant   Aggravating factors: playing basketball and golf Relieving factors: Nothing  PRECAUTIONS: None  WEIGHT BEARING RESTRICTIONS No  FALLS:  Has patient fallen in last 6 months? 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 X ray report in Epic indicated Negative shoulder x ray.  PATIENT SURVEYS:  FOTO 62 with a goal of 40  COGNITION:  Overall cognitive status: Within functional limits for tasks assessed      OBSERVATION: Prominent distal clavicle bilat though worse on R. Pt has hyperextension in bilat elbows  PALPATION: TTP:  R UT and medial scap mm at mid scap. None in shoulder or distal clavicle. Pt has mm spasm in R UT  UPPER EXTREMITY AROM/PROM:  A/PROM Right 11/20/2021 Left 11/20/2021  Shoulder flexion 5/5 5/5  Shoulder Scaption 4/5 5/5  Shoulder abduction 4+/5 4/5  Shoulder adduction    Shoulder internal rotation 4-/5 4+/5  Shoulder external rotation 4/5 4+/5  Middle Trap 3+/5 4-/5  Low Trap 3+/5 3+/5  Rhomboids 4-/5 4/5  Wrist extension    Wrist ulnar deviation    Wrist radial deviation    Wrist pronation    Wrist supination    (Blank rows = not tested)  UPPER EXTREMITY MMT:  MMT Right 11/20/2021  Left 11/20/2021  Shoulder flexion 179 176  Shoulder extension 175 176  Shoulder abduction 150 with pain 172  Shoulder ER 93 95  Shoulder IR  68 70  Lower trapezius    Elbow flexion    Elbow extension    Wrist flexion    Wrist extension    Wrist ulnar deviation    Wrist radial deviation    Wrist pronation    Wrist supination    Grip strength (lbs)    (Blank rows = not tested)  SHOULDER  SPECIAL TESTS:  Sulcus Test:  positive bilat     TODAY'S TREATMENT:  Pt performed prone Horizontal Abd at neutral and in ER x 10 reps, supine serratus punch x 10 reps and prone row x 10 reps AROM and x 10 reps with 2#.  Pt received a HEP handout and was educated in correct form and appropriate frequency.   See pt education below.    PATIENT EDUCATION: Education details: Pt received a HEP handout and was educated in correct form and appropriate frequency.  Pt instructed she should not have pain with exercises.  POC, objective findings, dx, relevant anatomy, prognosis, and rationale of exercises.   Person educated: Patient Education method: Explanation, Demonstration, Tactile cues, Verbal cues, and Handouts Education comprehension: verbalized understanding, returned demonstration, verbal cues required, tactile cues required, and needs further education   HOME EXERCISE PROGRAM: Access Code: [email or web address] URL: [email or web address] Date: 11/19/2021 Prepared by: Ronny Flurry  Exercises Single Arm Serratus Punches - 2 x daily - 5-6 x weekly - 2 sets - 10 reps Prone Single Arm Shoulder Horizontal Abduction with Scapular Retraction and Palm Down - 2 x daily - 5-6 x weekly - 2 sets - 10 reps Prone Shoulder Horizontal Abduction - 1 x daily - 5-6 x weekly - 2 sets - 10 reps Prone Shoulder Row - 1 x daily - 4 x weekly - 2-3 sets - 10 reps   ASSESSMENT:  CLINICAL IMPRESSION: Patient is a 41 y.o. female with a dx of chronic R shoulder pain presenting to the clinic with R scapular pain and muscle weakness in bilat shoulders and scapular R > L.  MD note indicated right shoulder scapular dyskinesis likely in the setting of multidirectional instability.  She has positive sulcus test bilat.  She has significant weakness in R scapula.   Pt reports she typically doesn't have pain while performing activities though has increased pain afterwards including work activities.  Pt reports she is not  limited with ADL/IADLs or reaching activities.  Pt has difficulty sleeping due to pain including pain with lying on R side. Pt has to limit her exercise activities and recreational activities.  Pt should benefit from skilled PT services to address goals and impairments and to assist with restoring PLOF.      Objective impairments include decreased activity tolerance, decreased strength, impaired UE functional use, and pain. These impairments are limiting patient from occupation and recreational activities . Personal factors including Time since onset of injury/illness/exacerbation and 1-2 comorbidities: Neck pain and chronic migraine/H/A's  are also affecting patient's functional outcome.    REHAB POTENTIAL: Good  CLINICAL DECISION MAKING: Stable/uncomplicated  EVALUATION COMPLEXITY: Low   GOALS:   SHORT TERM GOALS:  STG Name Target Date Goal status  1 Pt will be independent and compliant with HEP for improved pain, strength, stability, and function.  Baseline:  12/10/2021 INITIAL  2 Pt will report she is able to sleep without pain disturbance for at least 4/7 nights/wk  Baseline:  12/17/2021 INITIAL  3 Pt's worst pain will be < 8/10 pain for improved tolerance with activity Baseline: 12/10/2021 INITIAL                       LONG TERM GOALS:   LTG Name Target Date Goal status  1 Pt will demo improved R shoulder and cuff strength to 5/5 and scapular strength to at least 4+/5 MMT for improved strength and stability and improved tolerance with recreational activities.  Baseline: 12/31/2021 INITIAL  2 Pt will report at least a 70% improvement overall in pain and sx's.  Baseline: 12/31/2021 INITIAL  3 Pt will report she is able to perform her normal work activities without significant pain afterwards.  Baseline: 12/31/2021 INITIAL  4 Pt will report improved tolerance with exercise and recreational activities.  Baseline: 12/31/2021 INITIAL                  PLAN: PT FREQUENCY: 2x/week  PT  DURATION: 6 weeks  PLANNED INTERVENTIONS: Therapeutic exercises, Therapeutic activity, Neuro Muscular re-education, Patient/Family education, Joint mobilization, Aquatic Therapy, Dry Needling, Electrical stimulation, Spinal mobilization, Cryotherapy, Moist heat, Taping, Ultrasound, and Manual therapy  PLAN FOR NEXT SESSION: review and perform HEP.  Progress scapular stabs and shoulder stability.    Selinda Michaels III PT, DPT 11/20/21 6:10 PM

## 2021-12-08 NOTE — Therapy (Signed)
OUTPATIENT PHYSICAL THERAPY TREATMENT NOTE   Patient Name: Amber Ware MRN: [id number] DOB:1981/08/04, 41 y.o., female Today's Date: 12/09/2021  PCP: Flossie Buffy, NP REFERRING PROVIDER: Flossie Buffy, NP   PT End of Session - 12/09/21 1525     Visit Number 2    Number of Visits 12    Date for PT Re-Evaluation 12/31/21    Progress Note Due on Visit --   12/20/2021   PT Start Time 1436    PT Stop Time 1518    PT Time Calculation (min) 42 min    Activity Tolerance Patient tolerated treatment well    Behavior During Therapy [facility] for tasks assessed/performed             Past Medical History:  Diagnosis Date   Abnormal Pap smear of cervix 2014   Polycystic ovarian syndrome    Past Surgical History:  Procedure Laterality Date   CERVICAL BIOPSY  W/ LOOP ELECTRODE EXCISION  2014   COLPOSCOPY  2014   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   Liver masses 08/24/2019   Varicose veins of bilateral lower extremities with other complications 70/17/7939   Spider veins of limb 01/18/2018   Anxiety and depression 01/14/2018   Decreased sex drive 03/00/9233   Excessive hair growth 11/10/2016   Irregular menstrual cycle 11/10/2016    REFERRING PROVIDER: Vanetta Mulders, MD   REFERRING DIAG: [phone] (ICD-10-CM) - Chronic right shoulder pain     THERAPY DIAG:  Right shoulder pain, unspecified chronicity   Muscle weakness (generalized)     ONSET DATE: Chronic with exacerbation in September 2022   SUBJECTIVE:                                                                                                                                                                                       SUBJECTIVE STATEMENT: Pt saw ortho MD and he ordered x rays and PT.  MD note indicated  right shoulder scapular dyskinesis likely in the setting of multidirectional instability, baseline has several signs of ligamentous laxity. MD order indicated Rhomboids and lower trapezius for scapular strengthening, candidate for dry needling.  MD also recommended a figure 8 brace for posture and postural position.  She wears it 1-3 hours per day and reports some improvement in sx's.    Pt reports she typically doesn't have pain while performing activities though has increased pain afterwards.  Pt able to perform her normal work activities without increased pain though may have  pain afterwards.  Pt reports she is not limited with ADL/IADLs or reaching activities.  Pt has difficulty sleeping due to pain including pain with lying on R side. Pt has to limit her exercise activities including limiting her swimming and not playing golf due to pain.  Pt tried to play basketball and had significant pain afterwards.     Pt states she hasn't had any episodes of significant pain or H/A's recently.  Pt denies any adverse effects after prior Rx.  Pt reports compliance with HEP and has had no adverse effects with HEP.    PERTINENT HISTORY: Chronic pain, Neck pain, Chronic migraine/H/A's    PAIN:  Are you having pain? 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 X ray report in Epic indicated Negative shoulder x  ray.                TODAY'S TREATMENT:  Therapeutic Exercise: -Reviewed response to prior Rx, HEP compliance, current function, and pain level. -Pt performed: -Supine serratus punch 2x10 reps with 2# -prone Horizontal Abd at neutral 1x10 reps and with 1# 2x10 reps -prone horizontal abd with ER x 10 reps and with 1# 1x10 reps -prone row with 2# x 12 and 3# 2x10 reps -prone Y 2x10 reps -Prone extension with 1# 2x10 reps -Pt received a HEP handout and was educated in correct form and appropriate frequency.   See pt education below.   Neuromuscular Re-education: Pt performed:  -Supine rhythmic stabs at 90 deg flex 3x30 sec  -4D ball rolls on wall 2x10 reps each  -Serratus plus on wall 3x10 reps     PATIENT EDUCATION: Education details: Reviewed and Updated HEP.  Pt received a HEP handout and was educated in correct form and appropriate frequency.  Pt instructed she should not have pain with exercises.  POC, exercise form, and rationale of exercises.   Person educated: Patient Education method: Explanation, Demonstration, Tactile cues, Verbal cues, and Handouts Education comprehension: verbalized understanding, returned demonstration, verbal cues required, tactile cues required, and needs further education     HOME EXERCISE PROGRAM: Access Code: [email or web address] URL: [email or web address] Date: 12/09/2021 Prepared by: Ronny Flurry  Exercises Single Arm Serratus Punches - 2 x daily - 5-6 x weekly - 2 sets - 10 reps Prone Single Arm Shoulder Horizontal Abduction with Scapular Retraction and Palm Down - 2 x daily - 5-6 x weekly - 2 sets - 10 reps Prone Shoulder Horizontal Abduction - 1 x daily - 5-6 x weekly - 2 sets - 10 reps Prone Shoulder Row - 1 x daily - 4 x weekly - 2-3 sets - 10 reps Prone single arm shoulder Y - 1-2 x daily - 5-6 x weekly - 2 sets - 10 reps Prone Shoulder Extension - Single Arm - 1 x daily - 4 x weekly - 2 sets - 10 reps Standing Wall [employer]  with Mini [employer] - 1  x daily - 4-5 x weekly - 2 sets - 10 reps    ASSESSMENT:   CLINICAL IMPRESSION: Pt presents to Rx reporting improved sx's.  She is compliant with and tolerant of HEP.  PT progressed intensity of exercises today by adding new exercises and increasing resistance of select exercises.  Pt performed exercises well with cuing and instruction of correct form and positioning.  PT updated HEP and gave pt a HEP handout.  Pt responded well to Rx having no increased pain and was appropriately fatigued after Rx.  Pt should benefit from cont skilled PT services to address goals and impairments and to assist with restoring PLOF.       Objective impairments include decreased activity tolerance, decreased strength, impaired UE functional use, and pain. These impairments are limiting patient from occupation and recreational activities . Personal factors including Time since onset of injury/illness/exacerbation and 1-2 comorbidities: Neck pain and chronic migraine/H/A's  are also affecting patient's functional outcome.      REHAB POTENTIAL: Good   CLINICAL DECISION MAKING: Stable/uncomplicated   EVALUATION COMPLEXITY: Low     GOALS:     SHORT TERM GOALS:   STG Name Target Date Goal status  1 Pt will be independent and compliant with HEP for improved pain, strength, stability, and function.  Baseline:  12/10/2021 INITIAL  2 Pt will report she is able to sleep without pain disturbance for at least 4/7 nights/wk Baseline:  12/17/2021 INITIAL  3 Pt's worst pain will be < 8/10 pain for improved tolerance with activity Baseline: 12/10/2021 INITIAL                                        LONG TERM GOALS:    LTG Name Target Date Goal status  1 Pt will demo improved R shoulder and cuff strength to 5/5 and scapular strength to at least 4+/5 MMT for improved strength and stability and improved tolerance with recreational activities.  Baseline: 12/31/2021 INITIAL  2 Pt will report at least  a 70% improvement overall in pain and sx's.  Baseline: 12/31/2021 INITIAL  3 Pt will report she is able to perform her normal work activities without significant pain afterwards.  Baseline: 12/31/2021 INITIAL  4 Pt will report improved tolerance with exercise and recreational activities.  Baseline: 12/31/2021 INITIAL                               PLAN: PT FREQUENCY: 2x/week   PT DURATION: 6 weeks   PLANNED INTERVENTIONS: Therapeutic exercises, Therapeutic activity, Neuro Muscular re-education, Patient/Family education, Joint mobilization, Aquatic Therapy, Dry Needling, Electrical stimulation, Spinal mobilization, Cryotherapy, Moist heat, Taping, Ultrasound, and Manual therapy   PLAN FOR NEXT SESSION: review and perform HEP.  Progress scapular stabs and shoulder stability.    Selinda Michaels III PT, DPT 12/09/21 3:38 PM

## 2021-12-09 ENCOUNTER — Other Ambulatory Visit: Payer: Self-pay

## 2021-12-09 ENCOUNTER — Ambulatory Visit (HOSPITAL_BASED_OUTPATIENT_CLINIC_OR_DEPARTMENT_OTHER): Payer: No Typology Code available for payment source | Attending: Orthopaedic Surgery | Admitting: Physical Therapy

## 2021-12-09 ENCOUNTER — Encounter (HOSPITAL_BASED_OUTPATIENT_CLINIC_OR_DEPARTMENT_OTHER): Payer: Self-pay | Admitting: Physical Therapy

## 2021-12-09 DIAGNOSIS — M6281 Muscle weakness (generalized): Secondary | ICD-10-CM | POA: Insufficient documentation

## 2021-12-09 DIAGNOSIS — M25511 Pain in right shoulder: Secondary | ICD-10-CM | POA: Diagnosis not present

## 2021-12-16 ENCOUNTER — Encounter (HOSPITAL_BASED_OUTPATIENT_CLINIC_OR_DEPARTMENT_OTHER): Payer: Self-pay | Admitting: Physical Therapy

## 2021-12-16 ENCOUNTER — Ambulatory Visit (HOSPITAL_BASED_OUTPATIENT_CLINIC_OR_DEPARTMENT_OTHER): Payer: No Typology Code available for payment source | Admitting: Physical Therapy

## 2021-12-16 ENCOUNTER — Other Ambulatory Visit: Payer: Self-pay

## 2021-12-16 DIAGNOSIS — M25511 Pain in right shoulder: Secondary | ICD-10-CM | POA: Diagnosis not present

## 2021-12-16 DIAGNOSIS — M6281 Muscle weakness (generalized): Secondary | ICD-10-CM

## 2021-12-16 NOTE — Therapy (Signed)
OUTPATIENT PHYSICAL THERAPY TREATMENT NOTE   Patient Name: Amber Ware MRN: [id number] DOB:Sep 24, 1981, 41 y.o., female Today's Date: 12/16/2021  PCP: Flossie Buffy, NP REFERRING PROVIDER: Flossie Buffy, NP   PT End of Session - 12/16/21 1354     Visit Number 3    Number of Visits 12    Date for PT Re-Evaluation 12/31/21    Progress Note Due on Visit --   12/20/2021   PT Start Time 1345    PT Stop Time 1430    PT Time Calculation (min) 45 min    Activity Tolerance Patient tolerated treatment well    Behavior During Therapy [facility] for tasks assessed/performed              Past Medical History:  Diagnosis Date   Abnormal Pap smear of cervix 2014   Polycystic ovarian syndrome    Past Surgical History:  Procedure Laterality Date   CERVICAL BIOPSY  W/ LOOP ELECTRODE EXCISION  2014   COLPOSCOPY  2014   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   Liver masses 08/24/2019   Varicose veins of bilateral lower extremities with other complications 34/74/2595   Spider veins of limb 01/18/2018   Anxiety and depression 01/14/2018   Decreased sex drive 63/87/5643   Excessive hair growth 11/10/2016   Irregular menstrual cycle 11/10/2016    REFERRING PROVIDER: Vanetta Mulders, MD   REFERRING DIAG: [id number] (ICD-10-CM) - Chronic right shoulder pain     THERAPY DIAG:  Right shoulder pain, unspecified chronicity   Muscle weakness (generalized)     ONSET DATE: Chronic with exacerbation in September 2022   SUBJECTIVE:                                                                                                                                                                                       SUBJECTIVE STATEMENT: Pt saw ortho MD and he ordered x rays and PT.  MD note indicated  right shoulder scapular dyskinesis likely in the setting of multidirectional instability, baseline has several signs of ligamentous laxity. MD order indicated Rhomboids and lower trapezius for scapular strengthening, candidate for dry needling.  MD also recommended a figure 8 brace for posture and postural position.  She wears it 1-3 hours per day and reports some improvement in sx's.    Pt states sleeping has been a lot better.  "I feel good today".  Pt reports she is feeling better and her pain is improving.  Pt states she  has soreness.  Pt states she hasn't had any episodes of significant pain or H/A's recently.  Pt denies any adverse effects after prior Rx.  Pt reports compliance with HEP and has had no adverse effects with HEP.  Pt states her sx's are about the same after work.       PERTINENT HISTORY: Chronic pain, Neck pain, Chronic migraine/H/A's    PAIN:  Are you having pain? 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 X ray report in Epic indicated Negative shoulder x ray.                TODAY'S TREATMENT:  Therapeutic Exercise: -Reviewed response to prior Rx, HEP compliance, current function, and pain level. -Pt performed: -Supine serratus punch 2x12 reps with 2# and x10 reps with 3# -prone Horizontal Abd at neutral with 2# 2x10 reps -prone horizontal abd with ER with 1 2x10  reps -prone row with 3# 2x10 reps -prone Y 2x10 reps -Prone extension with 2# 2x10 reps -S/L ER 2x10 reps -Pt received a HEP handout and was educated in correct form and appropriate frequency.   See pt education below.   Neuromuscular Re-education: Pt performed:  -Supine rhythmic stabs at 90 deg flex 2x30 sec, at 60 deg x 30 sec, at 120 deg x 30 sec  -4D ball rolls on wall with 2.2# ball 2x10 reps each  -Serratus plus on wall 3x10 reps  -wall walks with RTB 2 sets of 5-8 reps     PATIENT EDUCATION: Education details: Reviewed and Updated HEP.  Pt received a HEP handout and was educated in correct form and appropriate frequency.  Pt instructed she should not have pain with exercises.  POC, exercise form, and rationale of exercises.   Person educated: Patient Education method: Explanation, Demonstration, Tactile cues, Verbal cues, and Handouts Education comprehension: verbalized understanding, returned demonstration, verbal cues required, tactile cues required, and needs further education     HOME EXERCISE PROGRAM: Access Code: [email or web address] URL: [email or web address] Date: 12/16/2021 Prepared by: Ronny Flurry  Exercises Single Arm Serratus Punches - 2 x daily - 5-6 x weekly - 2 sets - 10 reps Prone Single Arm Shoulder Horizontal Abduction with Scapular Retraction and Palm Down - 2 x daily - 5-6 x weekly - 2 sets - 10 reps Prone Shoulder Horizontal Abduction - 1 x daily - 5-6 x weekly - 2 sets - 10 reps Prone Shoulder Row - 1 x daily - 4 x weekly - 2-3 sets - 10 reps Prone single arm shoulder Y - 1-2 x daily - 5-6 x weekly - 2 sets - 10 reps Prone Shoulder Extension - Single Arm - 1 x daily - 4 x weekly - 2 sets - 10 reps Standing Wall Ball Circles with Mini Swiss Ball - 1 x daily - 4-5 x weekly - 2 sets - 10 reps Horizontal Wall Walk with Resistance - 1 x daily - 3-4  x weekly - 2 sets - 5 reps Wall serratus plus/scap squeeze 5x/wk 2 - 3 sets of 10 rep      ASSESSMENT:   CLINICAL IMPRESSION: Pt presents to Rx reporting improved pain and improved sleeping.  She is compliant with and tolerant of HEP.  PT progressed intensity of exercises today by adding new exercises and increasing resistance of select exercises.  Pt performed exercises well with cuing and instruction of correct form and positioning.  PT updated HEP and gave pt a HEP handout.  Pt responded well to Rx having no increased pain and was appropriately fatigued after Rx.  Pt should benefit from cont skilled PT services to address goals and impairments and to assist with restoring PLOF.       Objective impairments include decreased activity tolerance, decreased strength, impaired UE functional use, and pain. These impairments are limiting patient from occupation and recreational activities . Personal factors including Time since onset of injury/illness/exacerbation and 1-2 comorbidities: Neck pain and chronic migraine/H/A's  are also affecting patient's functional outcome.      REHAB POTENTIAL: Good   CLINICAL DECISION MAKING: Stable/uncomplicated   EVALUATION COMPLEXITY: Low     GOALS:     SHORT TERM GOALS:   STG Name Target Date Goal status  1 Pt will be independent and compliant with HEP for improved pain, strength, stability, and function.  Baseline:  12/10/2021 INITIAL  2 Pt will report she is able to sleep without pain disturbance for at least 4/7 nights/wk Baseline:  12/17/2021 INITIAL  3 Pt's worst pain will be < 8/10 pain for improved tolerance with activity Baseline: 12/10/2021 INITIAL                                        LONG TERM GOALS:    LTG Name Target Date Goal status  1 Pt will demo improved R shoulder and cuff strength to 5/5 and scapular strength to at least 4+/5 MMT for improved strength and stability and improved tolerance with recreational activities.  Baseline: 12/31/2021 INITIAL  2 Pt will report at least a 70% improvement overall in pain and sx's.   Baseline: 12/31/2021 INITIAL  3 Pt will report she is able to perform her normal work activities without significant pain afterwards.  Baseline: 12/31/2021 INITIAL  4 Pt will report improved tolerance with exercise and recreational activities.  Baseline: 12/31/2021 INITIAL                               PLAN:   PLANNED INTERVENTIONS: Therapeutic exercises, Therapeutic activity, Neuro Muscular re-education, Patient/Family education, Joint mobilization, Aquatic Therapy, Dry Needling, Electrical stimulation, Spinal mobilization, Cryotherapy, Moist heat, Taping, Ultrasound, and Manual therapy   PLAN FOR NEXT SESSION: review and perform HEP.  Progress scapular stabs and shoulder stability.  PN next visit   Selinda Michaels III PT, DPT 12/16/21 2:52 PM

## 2021-12-23 ENCOUNTER — Encounter (HOSPITAL_BASED_OUTPATIENT_CLINIC_OR_DEPARTMENT_OTHER): Payer: Self-pay | Admitting: Physical Therapy

## 2021-12-30 ENCOUNTER — Other Ambulatory Visit: Payer: Self-pay

## 2021-12-30 ENCOUNTER — Encounter (HOSPITAL_BASED_OUTPATIENT_CLINIC_OR_DEPARTMENT_OTHER): Payer: Self-pay | Admitting: Physical Therapy

## 2021-12-30 ENCOUNTER — Ambulatory Visit (HOSPITAL_BASED_OUTPATIENT_CLINIC_OR_DEPARTMENT_OTHER): Payer: No Typology Code available for payment source | Admitting: Physical Therapy

## 2021-12-30 DIAGNOSIS — M25511 Pain in right shoulder: Secondary | ICD-10-CM

## 2021-12-30 DIAGNOSIS — M6281 Muscle weakness (generalized): Secondary | ICD-10-CM

## 2021-12-30 NOTE — Therapy (Signed)
OUTPATIENT PHYSICAL THERAPY TREATMENT NOTE   Patient Name: Amber Ware MRN: [id number] DOB:October 13, 1981, 41 y.o., female Today's Date: 12/30/2021  PCP: Flossie Buffy, NP    PT End of Session - 12/30/21 1415     Visit Number 4    Number of Visits 12    Date for PT Re-Evaluation 12/31/21    PT Start Time 7412    PT Stop Time 1436    PT Time Calculation (min) 40 min    Activity Tolerance Patient tolerated treatment well    Behavior During Therapy [facility] for tasks assessed/performed               Past Medical History:  Diagnosis Date   Abnormal Pap smear of cervix 2014   Polycystic ovarian syndrome    Past Surgical History:  Procedure Laterality Date   CERVICAL BIOPSY  W/ LOOP ELECTRODE EXCISION  2014   COLPOSCOPY  2014   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   Liver masses 08/24/2019   Varicose veins of bilateral lower extremities with other complications 87/86/7672   Spider veins of limb 01/18/2018   Anxiety and depression 01/14/2018   Decreased sex drive 09/47/0962   Excessive hair growth 11/10/2016   Irregular menstrual cycle 11/10/2016    REFERRING PROVIDER: Vanetta Mulders, MD   REFERRING DIAG: [id number] (ICD-10-CM) - Chronic right shoulder pain     THERAPY DIAG:  Right shoulder pain, unspecified chronicity   Muscle weakness (generalized)     ONSET DATE: Chronic with exacerbation in September 2022   SUBJECTIVE:                                                                                                                                                                                       SUBJECTIVE STATEMENT: She wears the figure 8 brace for approx 2 hours per day and reports some improvement in sx's.    Pt states sleeping has been a lot better. Pt reports  she is feeling better and her pain is improving.  Pt states when she has pain, it doesn't last as long and is not as intense.  Pt denies any adverse effects after prior Rx.  Pt reports compliance with HEP and has had no adverse effects with HEP.  Pt continues to have occasional pain after work and states her sx's are about the same after work.       PERTINENT HISTORY: Chronic pain, Neck pain, Chronic migraine/H/A's    PAIN:  Are you having  pain? 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 X ray report in Epic indicated Negative shoulder x ray.                TODAY'S TREATMENT:  Therapeutic Exercise: -Reviewed response to prior Rx, HEP compliance, current function, and pain level. -Pt performed: -Supine serratus punch 3x10 reps with 3# -prone Horizontal Abd at neutral with 2# 2x10 reps -prone horizontal abd with ER with 1 2x10 reps -prone row with 3# 2x10 reps -prone Y 2x10 reps -Prone extension with 2# 2x10 reps -S/L ER 2x12 reps See pt education below.   Neuromuscular Re-education: Pt performed:  -Supine rhythmic stabs at 90, 60, 120 deg flex 1x1 min each  -4D ball rolls on wall with 2.2# ball 2x10 reps each  -Serratus plus on wall 3x10 reps  -wall walks with RTB 3 sets of 5 reps  -wall walks in an arc with RTB x 3 reps     PATIENT EDUCATION: Education details: HEP, POC, exercise form, and  rationale of exercises.   Person educated: Patient Education method: Explanation, Demonstration, Tactile cues, Verbal cues, and Handouts Education comprehension: verbalized understanding, returned demonstration, verbal cues required, tactile cues required, and needs further education     HOME EXERCISE PROGRAM: Access Code: [email or web address] URL: [email or web address] Date: 12/16/2021 Prepared by: Ronny Flurry  Exercises Single Arm Serratus Punches - 2 x daily - 5-6 x weekly - 2 sets - 10 reps Prone Single Arm Shoulder Horizontal Abduction with Scapular Retraction and Palm Down - 2 x daily - 5-6 x weekly - 2 sets - 10 reps Prone Shoulder Horizontal Abduction - 1 x daily - 5-6 x weekly - 2 sets - 10 reps Prone Shoulder Row - 1 x daily - 4 x weekly - 2-3 sets - 10 reps Prone single arm shoulder Y - 1-2 x daily - 5-6 x weekly - 2 sets - 10 reps Prone Shoulder Extension - Single Arm - 1 x daily - 4 x weekly - 2 sets - 10 reps Standing Wall Ball Circles with Mini Swiss Ball - 1 x daily - 4-5 x weekly - 2 sets - 10 reps Horizontal Wall Walk with Resistance - 1 x daily - 3-4 x weekly - 2 sets - 5 reps Wall serratus plus/scap squeeze 5x/wk 2 - 3 sets of 10 rep     ASSESSMENT:   CLINICAL IMPRESSION: Pt is progressing well with pain, sx's, stability, and strength.  She reports reduced intensity and duration of pain.  She is compliant with and tolerant of HEP.  Pt performed exercises well with cuing and instruction of correct form and positioning.  Pt responded well to Rx having no increased pain just soreness after Rx.  Pt should benefit from cont skilled PT services to address goals and impairments and to assist with restoring PLOF.       Objective impairments include decreased activity tolerance, decreased strength, impaired UE functional  use, and pain. These impairments are limiting patient from occupation and recreational activities . Personal factors including Time since onset of  injury/illness/exacerbation and 1-2 comorbidities: Neck pain and chronic migraine/H/A's  are also affecting patient's functional outcome.      REHAB POTENTIAL: Good   CLINICAL DECISION MAKING: Stable/uncomplicated   EVALUATION COMPLEXITY: Low     GOALS:     SHORT TERM GOALS:   STG Name Target Date Goal status  1 Pt will be independent and compliant with HEP for improved pain, strength, stability, and function.  Baseline:  12/10/2021 INITIAL  2 Pt will report she is able to sleep without pain disturbance for at least 4/7 nights/wk Baseline:  12/17/2021 INITIAL  3 Pt's worst pain will be < 8/10 pain for improved tolerance with activity Baseline: 12/10/2021 INITIAL                                        LONG TERM GOALS:    LTG Name Target Date Goal status  1 Pt will demo improved R shoulder and cuff strength to 5/5 and scapular strength to at least 4+/5 MMT for improved strength and stability and improved tolerance with recreational activities.  Baseline: 12/31/2021 INITIAL  2 Pt will report at least a 70% improvement overall in pain and sx's.  Baseline: 12/31/2021 INITIAL  3 Pt will report she is able to perform her normal work activities without significant pain afterwards.  Baseline: 12/31/2021 INITIAL  4 Pt will report improved tolerance with exercise and recreational activities.  Baseline: 12/31/2021 INITIAL                               PLAN:   PLANNED INTERVENTIONS: Therapeutic exercises, Therapeutic activity, Neuro Muscular re-education, Patient/Family education, Joint mobilization, Aquatic Therapy, Dry Needling, Electrical stimulation, Spinal mobilization, Cryotherapy, Moist heat, Taping, Ultrasound, and Manual therapy   PLAN FOR NEXT SESSION:  Pt states she may not be able to come to PT next week, but will schedule the week after.  Progress scapular stabs and shoulder stability.  PN next visit   Selinda Michaels III PT, DPT 12/30/21 9:22 PM

## 2022-01-06 ENCOUNTER — Other Ambulatory Visit (HOSPITAL_COMMUNITY)
Admission: RE | Admit: 2022-01-06 | Discharge: 2022-01-06 | Disposition: A | Discharge status: Home / Self Care | Payer: No Typology Code available for payment source | Source: Ambulatory Visit | Attending: Nurse Practitioner | Admitting: Nurse Practitioner

## 2022-01-06 ENCOUNTER — Ambulatory Visit (INDEPENDENT_AMBULATORY_CARE_PROVIDER_SITE_OTHER): Payer: No Typology Code available for payment source | Admitting: Nurse Practitioner

## 2022-01-06 ENCOUNTER — Other Ambulatory Visit: Payer: Self-pay

## 2022-01-06 ENCOUNTER — Encounter: Payer: Self-pay | Admitting: Nurse Practitioner

## 2022-01-06 VITALS — BP 116/70 | Ht 66.0 in | Wt 152.0 lb

## 2022-01-06 DIAGNOSIS — R69 Illness, unspecified: Secondary | ICD-10-CM | POA: Diagnosis not present

## 2022-01-06 DIAGNOSIS — Z01419 Encounter for gynecological examination (general) (routine) without abnormal findings: Secondary | ICD-10-CM | POA: Diagnosis not present

## 2022-01-06 DIAGNOSIS — Z113 Encounter for screening for infections with a predominantly sexual mode of transmission: Secondary | ICD-10-CM | POA: Diagnosis not present

## 2022-01-06 DIAGNOSIS — Z803 Family history of malignant neoplasm of breast: Secondary | ICD-10-CM | POA: Diagnosis not present

## 2022-01-06 DIAGNOSIS — Z3041 Encounter for surveillance of contraceptive pills: Secondary | ICD-10-CM

## 2022-01-06 MED ORDER — NORGESTIM-ETH ESTRAD TRIPHASIC 0.18/0.215/0.25 MG-25 MCG PO TABS: 1.0000 | ORAL_TABLET | Freq: Every day | ORAL | 3 refills | Status: DC

## 2022-01-06 NOTE — Progress Notes (Signed)
? ?Amber Ware May 16, 1981 [id number] ? ? ?History:  41 y.o. G0 presents for annual exam. Monthly cycles. 2014 LEEP. Migraines managed by PCP. Family history of breast cancer in mother (age 57) and 2 maternal aunts (age 71 and 28). ? ?Gynecologic History ?Patient's last menstrual period was 12/21/2021. ?Period Cycle (Days): 28 ?Period Duration (Days): 4 ?Period Pattern: Regular ?Menstrual Flow: Moderate ?Dysmenorrhea: None ?Contraception/Family planning: OCP (estrogen/progesterone) ?Sexually active: Yes ? ?Health Maintenance ?Last Pap: 09/27/2018. Results were: Normal ?Last mammogram: Never ?Last colonoscopy: Not indicated ?Last Dexa: Not indicated ? ?Past medical history, past surgical history, family history and social history were all reviewed and documented in the EPIC chart. Works for [employer] in [place].  ? ?ROS:  A ROS was performed and pertinent positives and negatives are included. ? ?Exam: ? ?Vitals:  ? 01/06/22 1424  ?BP: 116/70  ?Weight: 152 lb (68.9 kg)  ?Height: '5\' 6"'$  (1.676 m)  ? ?Body mass index is 24.53 kg/m?. ? ?General appearance:  Normal ?Thyroid:  Symmetrical, normal in size, without palpable masses or nodularity. ?Respiratory ? Auscultation:  Clear without wheezing or rhonchi ?Cardiovascular ? Auscultation:  Regular rate, without rubs, murmurs or gallops ? Edema/varicosities:  Not grossly evident ?Abdominal ? Soft,nontender, without masses, guarding or rebound. ? Liver/spleen:  No organomegaly noted ? Hernia:  None appreciated ? Skin ? Inspection:  Grossly normal ?Breasts: Examined lying and sitting.  ? Right: Without masses, retractions, nipple discharge or axillary adenopathy. ? ? Left: Without masses, retractions, nipple discharge or axillary adenopathy. ?Genitourinary  ? Inguinal/mons:  Normal without inguinal adenopathy ? External genitalia:  Normal appearing vulva with no masses, tenderness, or lesions ? BUS/Urethra/Skene's glands:  Normal ? Vagina:  Normal  appearing with normal color and discharge, no lesions ? Cervix:  Normal appearing without discharge or lesions ? Uterus:  Normal in size, shape and contour.  Midline and mobile, nontender ? Adnexa/parametria:   ?  Rt: Normal in size, without masses or tenderness. ?  Lt: Normal in size, without masses or tenderness. ? Anus and perineum: Normal ? Digital rectal exam: Not indicated ? ?Patient informed chaperone available to be present for breast and pelvic exam. Patient has requested no chaperone to be present. Patient has been advised what will be completed during breast and pelvic exam.  ? ?Assessment/Plan:  41 y.o. G0 for annual exam.  ? ?Well female exam with routine gynecological exam - Plan: Cytology - PAP( ). Education provided on SBEs, importance of preventative screenings, current guidelines, high calcium diet, regular exercise, and multivitamin daily. Labs with PCP.  ? ?Family history of breast cancer - Mother diagnosed with breast cancer at age 68, 2 maternal aunts with breast cancer at age 44 and 3. Unsure if any of them have had genetic testing done. Will talk to mother about getting this done.  ? ?Encounter for surveillance of contraceptive pills - Plan: Norgestimate-Ethinyl Estradiol Triphasic (ORTHO TRI-CYCLEN LO) 0.18/0.215/0.25 MG-25 MCG tab daily. Taking as prescribed. Refill x 1 year provided.  ? ?Screen for STD (sexually transmitted disease) - Plan: Cytology - PAP( ). Gonorrhea, chlamydia added to pap. Declines HIV, RPR.  ? ?Screening for cervical cancer - 2014 LEEP. Pap with HR HPV today.  ? ?Screening for breast cancer - Has not had screening mammogram. Discussed current guidelines and importance of preventative screenings. Information provided on local breast imaging center.  ? ?Return in 1 year for annual.  ? ? ? ?[name] DNP, 2:41 PM 01/06/2022 ? ?

## 2022-01-07 LAB — CYTOLOGY - PAP
Chlamydia: NEGATIVE
Comment: NEGATIVE
Comment: NEGATIVE
Comment: NORMAL
Diagnosis: NEGATIVE
High risk HPV: NEGATIVE
Neisseria Gonorrhea: NEGATIVE

## 2022-01-13 ENCOUNTER — Ambulatory Visit (HOSPITAL_BASED_OUTPATIENT_CLINIC_OR_DEPARTMENT_OTHER): Payer: No Typology Code available for payment source | Attending: Orthopaedic Surgery | Admitting: Physical Therapy

## 2022-01-13 ENCOUNTER — Other Ambulatory Visit: Payer: Self-pay

## 2022-01-13 DIAGNOSIS — M25511 Pain in right shoulder: Secondary | ICD-10-CM | POA: Diagnosis not present

## 2022-01-13 DIAGNOSIS — M6281 Muscle weakness (generalized): Secondary | ICD-10-CM | POA: Diagnosis not present

## 2022-01-13 NOTE — Therapy (Addendum)
OUTPATIENT PHYSICAL THERAPY TREATMENT NOTE / PROGRESS NOTE   Patient Name: Amber Ware MRN: [id number] DOB:01-04-1981, 41 y.o., female Today's Date: 01/14/2022  PCP: Flossie Buffy, NP    PT End of Session - 01/13/22 1518     Visit Number 5    Number of Visits 11    Date for PT Re-Evaluation 12/31/21    PT Start Time 4142    PT Stop Time 1522    PT Time Calculation (min) 43 min    Activity Tolerance Patient tolerated treatment well    Behavior During Therapy 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   Liver masses 08/24/2019   Varicose veins of bilateral lower extremities with other complications 39/53/2023   Spider veins of limb 01/18/2018   Anxiety and depression 01/14/2018   Decreased sex drive 34/35/6861   Excessive hair growth 11/10/2016   Irregular menstrual cycle 11/10/2016    REFERRING PROVIDER: Vanetta Mulders, MD   REFERRING DIAG: [phone] (ICD-10-CM) - Chronic right shoulder pain     THERAPY DIAG:  Right shoulder pain, unspecified chronicity   Muscle weakness (generalized)     ONSET DATE: Chronic with exacerbation in September 2022   SUBJECTIVE:                                                                                                                                                                                       SUBJECTIVE STATEMENT: Pt states she is "significantly better".  She still has pain after work though has improved.  Pt states when she has pain, it doesn't  last as long and is not as intense.  Pt also has a different schedule at work which helps and also has given her more time to perform her HEP.  Pt states sleeping has been a lot better.  She has disturbed sleep 2 nights/wk.  Pt denies any adverse effects after prior Rx.  Pt reports compliance with HEP and has had no adverse effects with HEP.  Pt reported a 70% improvement overall in pain and sx's.  Pt has less pain performing recreational activities and playing with dogs.  PERTINENT HISTORY: Chronic pain, Neck pain, Chronic migraine/H/A's    PAIN:  Are you having pain? 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 X ray report in Epic indicated Negative shoulder x ray.  FOTO:  FOTO 62 with a goal of 37  PALPATION: -TTP:  lateral and posterior shoulder, inferior scapula   -R shoulder Abd: 159 deg without pain  (150 deg with pain on IE)  Special Test: Sulcus Test Positive bilat   STRENGTH Right 11/20/2021 Right 01/13/2022 Left 11/20/2021 Left 01/13/2022  Shoulder flexion 5/5 5/5 5/5   Shoulder Scaption 4/5 5/5 5/5   Shoulder abduction 4+/5 4+/5 4/5   Shoulder adduction        Shoulder internal rotation 4-/5 4/5 4+/5   Shoulder external rotation 4/5 4+/5 4+/5   Middle Trap 3+/5 3+/5 4-/5 3+/5  Low Trap 3+/5 4-/5 3+/5 4/5  Rhomboids 4-/5 4-/5 4/5 4/5  Wrist extension        Wrist ulnar  deviation        Wrist radial deviation        Wrist pronation        Wrist supination        (Blank rows = not tested)                 TODAY'S TREATMENT:  Therapeutic Exercise: -Reviewed response to prior Rx, HEP compliance, current function, and pain level. -Pt performed: -prone horizontal abd with ER with 1 2x10 reps -S/L ER  WITH 1# 2X10 reps See pt education below.   Neuromuscular Re-education: Pt performed:  -Serratus plus on rail 2x10 reps  -wall walks with RTB 3 sets of 5 reps  -wall walks in an arc with RTB x 3 reps  -Rhythmic stab's with p-ball on wall 3x30 sec     PATIENT EDUCATION: Education details: HEP, POC, exercise form, and rationale of exercises.   Person educated: Patient Education method: Explanation, Demonstration, Tactile cues, Verbal cues, and Handouts Education comprehension: verbalized understanding, returned demonstration, verbal cues required, tactile cues required, and needs further education     HOME EXERCISE PROGRAM: Access Code: [email or web address] URL: [email or web address] Date: 12/16/2021 Prepared by: Ronny Flurry  Exercises Single Arm Serratus Punches - 2 x daily - 5-6 x weekly - 2 sets - 10 reps Prone Single Arm Shoulder Horizontal Abduction with Scapular Retraction and Palm Down - 2 x daily - 5-6 x weekly - 2 sets - 10 reps Prone Shoulder Horizontal Abduction - 1 x daily - 5-6 x weekly - 2 sets - 10 reps Prone Shoulder Row - 1 x daily - 4 x weekly - 2-3 sets - 10 reps Prone single arm shoulder Y - 1-2 x daily - 5-6 x weekly - 2 sets - 10 reps Prone Shoulder Extension - Single Arm - 1 x daily - 4 x weekly - 2 sets - 10 reps Standing Wall [employer] with Mini [employer] -  1 x daily - 4-5 x weekly - 2 sets - 10 reps Horizontal Wall Walk with Resistance - 1 x daily - 3-4 x weekly - 2 sets - 5 reps Wall serratus plus/scap squeeze 5x/wk 2 - 3 sets of 10 rep     ASSESSMENT:   CLINICAL IMPRESSION: Pt has made good progress with  pain, sx's, and function, as evidenced by subjective reports.  She continues to have pain after work though has reduced duration and intensity of pain.  Pt also has improved sleeping.  Pt demonstrates improved R shoulder strength in scaption and RTC strength and bilat LT strength.  Though she has improved with strength, she continues to have significant weakness in R scapula.  Pt is progressing well with ther ex and neuro re-ed activities.  Pt has met all STG's and LTG #2 and is progressing toward other LTG's.  PT should benefit from cont skilled PT to address ongoing goals, improve scapular/shoulder strength, and to restore PLOF.    Objective impairments include decreased activity tolerance, decreased strength, impaired UE functional use, and pain. These impairments are limiting patient from occupation and recreational activities . Personal factors including Time since onset of injury/illness/exacerbation and 1-2 comorbidities: Neck pain and chronic migraine/H/A's  are also affecting patient's functional outcome.      REHAB POTENTIAL: Good   CLINICAL DECISION MAKING: Stable/uncomplicated   EVALUATION COMPLEXITY: Low     GOALS:     SHORT TERM GOALS:   STG Name Target Date Goal status  1 Pt will be independent and compliant with HEP for improved pain, strength, stability, and function.  Baseline:  12/10/2021 GOAL MET  2 Pt will report she is able to sleep without pain disturbance for at least 4/7 nights/wk Baseline:  12/17/2021 GOAL MET  3 Pt's worst pain will be < 8/10 pain for improved tolerance with activity Baseline: 12/10/2021 GOAL MET                                         LONG TERM GOALS:    LTG Name Target Date Goal status  1 Pt will demo improved R shoulder and cuff strength to 5/5 and scapular strength to at least 4+/5 MMT for improved strength and stability and improved tolerance with recreational activities.  Baseline: 02/24/2022 PROGRESSING  2 Pt will report at least a 70%  improvement overall in pain and sx's.  Baseline: 12/31/2021 GOAL MET   3 Pt will report she is able to perform her normal work activities without significant pain afterwards.  Baseline: 02/24/2022 PROGRESSING  4 Pt will report improved tolerance with exercise and recreational activities.  Baseline: 02/24/2022 PROGRESSING                               PLAN: FREQUENCY: ONCE PER WEEK  DURATION:  4 - 6 WEEKS  PLANNED INTERVENTIONS: Therapeutic exercises, Therapeutic activity, Neuro Muscular re-education, Patient/Family education, Joint mobilization, Aquatic Therapy, Dry Needling, Electrical stimulation, Spinal mobilization, Cryotherapy, Moist heat, Taping, Ultrasound, and Manual therapy   PLAN FOR NEXT SESSION:  Pt states she may not be able to come to PT next week, but will schedule the week after.  Progress scapular stabs and shoulder stability.     Selinda Michaels III PT, DPT 01/14/22 8:33 PM  PHYSICAL THERAPY DISCHARGE SUMMARY  Visits from Start of Care:  5  Current functional level related to goals / functional outcomes: See above   Remaining deficits: See above   Education / Equipment: Pt has a HEP.   Patient was seen in PT from 11/09/21 - 01/13/22.  A progress note was completed on her last visit.  Pt was making good progress in pain, sx's, function, and toward goals.  For detailed progress toward goals and objective findings, please refer to progress note above.  Pt cancelled her following appointments due to financial reasons.  Pt will be discharged from skilled PT services at this time and will cont with HEP.   Selinda Michaels III PT, DPT 06/15/22 8:27 AM

## 2022-01-14 ENCOUNTER — Encounter (HOSPITAL_BASED_OUTPATIENT_CLINIC_OR_DEPARTMENT_OTHER): Payer: Self-pay | Admitting: Physical Therapy

## 2022-02-02 ENCOUNTER — Encounter (HOSPITAL_BASED_OUTPATIENT_CLINIC_OR_DEPARTMENT_OTHER): Payer: No Typology Code available for payment source | Admitting: Physical Therapy

## 2022-02-09 ENCOUNTER — Encounter (HOSPITAL_BASED_OUTPATIENT_CLINIC_OR_DEPARTMENT_OTHER): Payer: Self-pay | Admitting: Physical Therapy

## 2022-02-16 ENCOUNTER — Encounter (HOSPITAL_BASED_OUTPATIENT_CLINIC_OR_DEPARTMENT_OTHER): Payer: Self-pay | Admitting: Physical Therapy

## 2022-02-23 ENCOUNTER — Encounter (HOSPITAL_BASED_OUTPATIENT_CLINIC_OR_DEPARTMENT_OTHER): Payer: Self-pay | Admitting: Physical Therapy

## 2022-04-27 ENCOUNTER — Other Ambulatory Visit: Payer: Self-pay | Admitting: Nurse Practitioner

## 2022-04-27 DIAGNOSIS — Z1231 Encounter for screening mammogram for malignant neoplasm of breast: Secondary | ICD-10-CM

## 2022-05-04 ENCOUNTER — Ambulatory Visit
Admission: RE | Admit: 2022-05-04 | Discharge: 2022-05-04 | Disposition: A | Discharge status: Home / Self Care | Payer: No Typology Code available for payment source | Source: Ambulatory Visit | Attending: Nurse Practitioner | Admitting: Nurse Practitioner

## 2022-05-04 DIAGNOSIS — Z1231 Encounter for screening mammogram for malignant neoplasm of breast: Secondary | ICD-10-CM

## 2022-06-10 ENCOUNTER — Ambulatory Visit (HOSPITAL_BASED_OUTPATIENT_CLINIC_OR_DEPARTMENT_OTHER): Payer: No Typology Code available for payment source | Admitting: Orthopaedic Surgery

## 2022-08-28 ENCOUNTER — Ambulatory Visit (INDEPENDENT_AMBULATORY_CARE_PROVIDER_SITE_OTHER): Payer: Self-pay | Admitting: Orthopaedic Surgery

## 2022-08-28 ENCOUNTER — Ambulatory Visit (INDEPENDENT_AMBULATORY_CARE_PROVIDER_SITE_OTHER): Payer: Self-pay

## 2022-08-28 DIAGNOSIS — M542 Cervicalgia: Secondary | ICD-10-CM

## 2022-08-28 NOTE — Progress Notes (Signed)
Chief Complaint: right shoulder pain     History of Present Illness:   08/28/2022: Presents today for follow-up of his right neck and shoulder pain.  At this time she is continuing to experience headaches at the neck down the shoulder.  She is experiencing deep popping at the base of her neck near the facets.  This is quite disabling for her.  Amber Ware is a 41 y.o. female left hand dominant presents with bilateral although right worse than left shoulder pain now going on for several years.  These will occasionally flareup and get sore as though she felt like she was sleeping on it wrong.  She has tried acupuncture as well as coming massage and physical therapy in the past this was not effective.  She also endorses a protruding bone about the right AC joint area.  She is taken over-the-counter pain medication and balm's which have not been relieving.  She does not like the sedating nature of muscle relaxers.  She works Theatre manager in the produce department    Surgical History:   None  PMH/PSH/Family History/Social History/Meds/Allergies:    Past Medical History:  Diagnosis Date   Abnormal Pap smear of cervix 2014   Polycystic ovarian syndrome    Past Surgical History:  Procedure Laterality Date   CERVICAL BIOPSY  W/ LOOP ELECTRODE EXCISION  2014   COLPOSCOPY  2014   Social History   Socioeconomic History   Marital status: Divorced    Spouse name: Not on file   Number of children: Not on file   Years of education: Not on file   Highest education level: Not on file  Occupational History   Not on file  Tobacco Use   Smoking status: Former   Smokeless tobacco: Never  Vaping Use   Vaping Use: Never used  Substance and Sexual Activity   Alcohol use: No   Drug use: Not on file   Sexual activity: Yes    Partners: Male    Birth control/protection: OCP  Other Topics Concern   Not on file  Social History Narrative    Not on file   Social Determinants of Health   Financial Resource Strain: Not on file  Food Insecurity: Not on file  Transportation Needs: Not on file  Physical Activity: Not on file  Stress: Not on file  Social Connections: Not on file   Family History  Problem Relation Age of Onset   Breast cancer Mother 56       still alive   Thyroid disease Mother    Hypertension Father    Breast cancer Maternal Aunt 44   Breast cancer Maternal Aunt 51   No Known Allergies Current Outpatient Medications  Medication Sig Dispense Refill   cyclobenzaprine (FLEXERIL) 5 MG tablet Take 1-2 tablets (5-10 mg total) by mouth at bedtime as needed for muscle spasms. (Patient not taking: Reported on 01/06/2022) 30 tablet 0   methocarbamol (ROBAXIN) 500 MG tablet Take 1 tablet (500 mg total) by mouth 4 (four) times daily. (Patient not taking: Reported on 01/06/2022) 30 tablet 2   metroNIDAZOLE (FLAGYL) 500 MG tablet Take 1 tablet (500 mg total) by mouth 2 (two) times daily. (Patient not taking: Reported on 04/14/2021) 14 tablet 0   Norgestimate-Ethinyl Estradiol Triphasic (ORTHO TRI-CYCLEN LO) 0.18/0.215/0.25 MG-25  MCG tab Take 1 tablet by mouth daily. 84 tablet 3   SUMAtriptan (IMITREX) 25 MG tablet TAKE 1 TABLET EVERY 2 (TWO) HOURS AS NEEDED FOR MIGRAINE. NO MORE THAN '100MG'$  IN 24HRS (Patient not taking: Reported on 01/06/2022) 10 tablet 0   No current facility-administered medications for this visit.   No results found.  Review of Systems:   A ROS was performed including pertinent positives and negatives as documented in the HPI.  Physical Exam :   Constitutional: NAD and appears stated age Neurological: Alert and oriented Psych: Appropriate affect and cooperative There were no vitals taken for this visit.   Comprehensive Musculoskeletal Exam:    Musculoskeletal Exam    Inspection Right Left  Skin No atrophy or winging No atrophy or winging  Palpation    Tenderness Upper trap right subscapular  Upper trap, medial scapula  Range of Motion    Flexion (passive) 170 170  Flexion (active) 170 170  Abduction 170 170  ER at the side 70 70  Can reach behind back to T12 T12  Strength     Full Full  Special Tests    Pseudoparalytic No No  Neurologic    Fires PIN, radial, median, ulnar, musculocutaneous, axillary, suprascapular, long thoracic, and spinal accessory innervated muscles. No abnormal sensibility  Vascular/Lymphatic    Radial Pulse 2+ 2+  Cervical Exam    Patient has symmetric cervical range of motion with negative Spurling's test.  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   Assessment:   41 year old female with right neck and shoulder pain today which is more consistent with cervical facet disease.  Specifically she does have quite a hypertrophied abnormal appearing facet at the C7-T1 level.  This appears to be emanating pain from this level which has been resulted in migraines and neck pain.  Side effect I do believe referral to my partner Dr. Ernestina Patches would be helpful for possibly a targeted facet injection and question of facet denervation if this is successful.  Plan :    -She will return to clinic as needed     I personally saw and evaluated the patient, and participated in the management and treatment plan.  Vanetta Mulders, MD Attending Physician, Orthopedic Surgery  This document was dictated using Dragon voice recognition software. A reasonable attempt at proof reading has been made to minimize errors.

## 2022-09-01 ENCOUNTER — Ambulatory Visit (INDEPENDENT_AMBULATORY_CARE_PROVIDER_SITE_OTHER): Payer: BC Managed Care – PPO | Admitting: Physical Medicine and Rehabilitation

## 2022-09-01 ENCOUNTER — Encounter: Payer: Self-pay | Admitting: Physical Medicine and Rehabilitation

## 2022-09-01 VITALS — BP 168/116 | HR 65

## 2022-09-01 DIAGNOSIS — M542 Cervicalgia: Secondary | ICD-10-CM | POA: Diagnosis not present

## 2022-09-01 DIAGNOSIS — M5412 Radiculopathy, cervical region: Secondary | ICD-10-CM | POA: Diagnosis not present

## 2022-09-01 DIAGNOSIS — M25511 Pain in right shoulder: Secondary | ICD-10-CM

## 2022-09-01 DIAGNOSIS — M7918 Myalgia, other site: Secondary | ICD-10-CM

## 2022-09-01 DIAGNOSIS — M25512 Pain in left shoulder: Secondary | ICD-10-CM | POA: Diagnosis not present

## 2022-09-01 DIAGNOSIS — G8929 Other chronic pain: Secondary | ICD-10-CM

## 2022-09-01 NOTE — Progress Notes (Unsigned)
Numeric Pain Rating Scale and Functional Assessment Average Pain 7   In the last MONTH (on 0-10 scale) has pain interfered with the following?  1. General activity like being  able to carry out your everyday physical activities such as walking, climbing stairs, carrying groceries, or moving a chair?  Rating(7)     Tight and stiff in shoulders. Lifting things too heavy can make pain worse at times. Pain radiates into head. Pain in neck and shoulder on either side with numbness down the arm. Tried PT, acupuncture, dry needling, deep tissue massage

## 2022-09-01 NOTE — Progress Notes (Unsigned)
Amber Ware - 41 y.o. female MRN [id number]  Date of birth: Mar 24, 1981  Office Visit Note: Visit Date: 09/01/2022 PCP: Flossie Buffy, NP Referred by: Flossie Buffy, NP  Subjective: Chief Complaint  Patient presents with   Neck - Pain   HPI: Amber Ware is a 41 y.o. female who comes in today per the request of Dr. Vanetta Mulders for evaluation of bilateral neck pain radiating to shoulders and bilateral scapular regions, right greater than left. Intermittent radiation of pain to top of head.  Pain ongoing for several years and is exacerbated by movement and activity. Side-to-side rotation causes pulling sensation to bilateral scapular regions.  Patient also notices pain increases after lifting heavy objects.  She describes the pain as a sore and aching sensation, currently rates as 8 out of 10.  Patient has tried home exercise regimen, rest and use of various medications. No relief of pain with Meloxicam and Ibuprofen.  She has also attended multiple courses of formal physical therapy and dry needling with minimal relief of pain.  Overall, patient feels conservative therapies have not helped to alleviate her pain. Recent cervical x-ray imaging exhibits straightening of the normal cervical lordosis, uncovertebral spurring on the right at C3-4, no listhesis. No history of cervical surgery/injections. Patient reports pain is negatively impacting her daily life, currently working at grocery store. Patient denies focal weakness, numbness and tingling. Patient denies recent trauma or falls.    Review of Systems  Musculoskeletal:  Positive for myalgias and neck pain.  Neurological:  Negative for tingling, sensory change, focal weakness and weakness.  All other systems reviewed and are negative.  Otherwise per HPI.  Assessment & Plan: Visit Diagnoses:    ICD-10-CM   1. Bilateral neck pain  M54.2 MR CERVICAL SPINE WO CONTRAST    2. Radiculopathy, cervical region  M54.12 MR  CERVICAL SPINE WO CONTRAST    3. Chronic right shoulder pain  M25.511 MR CERVICAL SPINE WO CONTRAST   G89.29     4. Chronic left shoulder pain  M25.512 MR CERVICAL SPINE WO CONTRAST   G89.29     5. Myofascial pain syndrome  M79.18 MR CERVICAL SPINE WO CONTRAST       Plan: Findings:  Chronic, worsening and severe bilateral neck pain radiating to shoulders and bilateral scapular regions.  Patient continues to have severe pain despite good conservative therapy such as formal physical therapy, home exercise regimen, rest and use of medications.  Patient's clinical presentation and exam are complex, differentials could include cervical radiculopathy versus facet mediated pain. The C5-C6 and C6-C7 facet joints can refer down to scapula. I also feel there is a myofascial component contributing to her pain as she does have discomfort that radiates up to the top of her head.  This pain radiating to her head could also be related to her chronic migraines.  Next step is to obtain cervical MRI imaging, we will have patient follow-up with [place] after imaging is obtained for review and to discuss further treatment.  I did discuss cervical injections with patient today, depending on results of MRI imaging we would consider cervical epidural steroid injection or cervical facet injections.  Patient encouraged to remain active as tolerated, no red flag symptoms noted upon exam today.    Meds & Orders: No orders of the defined types were placed in this encounter.   Orders Placed This Encounter  Procedures   MR CERVICAL SPINE WO CONTRAST    Follow-up: Return for follow up  after cervical MRI imaging is obtained.   Procedures: No procedures performed      Clinical History: EXAM: CERVICAL SPINE - COMPLETE 4+ VIEW   COMPARISON:  CT of the cervical spine 04/12/2005   FINDINGS: Prevertebral soft tissues are within normal limits. No significant listhesis is present. Straightening of the normal cervical  lordosis is noted. Uncovertebral spurring is noted on the right at C3-4.   Flexion and extension radiographs demonstrate physiologic motion. The lung apices are clear.   IMPRESSION: 1. Uncovertebral spurring on the right at C3-4. 2. Straightening of the normal cervical lordosis is nonspecific, but can be seen in the setting of muscle spasm or positioning.     Electronically Signed   By: San Morelle M.D.   On: 09/01/2022 08:17   She reports that she has quit smoking. She has never used smokeless tobacco. No results for input(s): "HGBA1C", "LABURIC" in the last 8760 hours.  Objective:  VS:  HT:    WT:   BMI:     BP:(!) 168/116  HR:65bpm  TEMP: ( )  RESP:  Physical Exam Vitals and nursing note reviewed.  HENT:     Head: Normocephalic and atraumatic.     Right Ear: External ear normal.     Left Ear: External ear normal.     Nose: Nose normal.     Mouth/Throat:     Mouth: Mucous membranes are moist.  Eyes:     Extraocular Movements: Extraocular movements intact.  Cardiovascular:     Rate and Rhythm: Normal rate.     Pulses: Normal pulses.  Pulmonary:     Effort: Pulmonary effort is normal.  Abdominal:     General: Abdomen is flat. There is no distension.  Musculoskeletal:        General: Tenderness present.     Cervical back: Tenderness present.     Comments: Discomfort noted with flexion, extension and side-to-side rotation. Patient has good strength in the upper extremities including 5 out of 5 strength in wrist extension, long finger flexion and APB.  There is no atrophy of the hands intrinsically.  Sensation intact bilaterally. Negative Hoffman's sign.  Skin:    General: Skin is warm and dry.     Capillary Refill: Capillary refill takes less than 2 seconds.  Neurological:     General: No focal deficit present.     Mental Status: She is alert and oriented to person, place, and time.  Psychiatric:        Mood and Affect: Mood normal.        Behavior:  Behavior normal.     Ortho Exam  Imaging: No results found.  Past Medical/Family/Surgical/Social History: Medications & Allergies reviewed per EMR, new medications updated. 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   Liver masses 08/24/2019   Varicose veins of bilateral lower extremities with other complications 31/54/0086   Spider veins of limb 01/18/2018   Anxiety and depression 01/14/2018   Decreased sex drive 76/19/5093   Excessive hair growth 11/10/2016   Irregular menstrual cycle 11/10/2016   Past Medical History:  Diagnosis Date   Abnormal Pap smear of cervix 2014   Polycystic ovarian syndrome    Family History  Problem Relation Age of Onset   Breast cancer Mother 71       still alive   Thyroid disease Mother  Hypertension Father    Breast cancer Maternal Aunt 37   Breast cancer Maternal Aunt 55   Past Surgical History:  Procedure Laterality Date   CERVICAL BIOPSY  W/ LOOP ELECTRODE EXCISION  2014   COLPOSCOPY  2014   Social History   Occupational History   Not on file  Tobacco Use   Smoking status: Former   Smokeless tobacco: Never  Vaping Use   Vaping Use: Never used  Substance and Sexual Activity   Alcohol use: No   Drug use: Not on file   Sexual activity: Yes    Partners: Male    Birth control/protection: OCP

## 2022-09-10 ENCOUNTER — Encounter: Payer: Self-pay | Admitting: Physical Medicine and Rehabilitation

## 2022-09-15 ENCOUNTER — Ambulatory Visit
Admission: RE | Admit: 2022-09-15 | Discharge: 2022-09-15 | Disposition: A | Discharge status: Home / Self Care | Payer: BC Managed Care – PPO | Source: Ambulatory Visit | Attending: Physical Medicine and Rehabilitation | Admitting: Physical Medicine and Rehabilitation

## 2022-09-15 DIAGNOSIS — M542 Cervicalgia: Secondary | ICD-10-CM | POA: Diagnosis not present

## 2022-09-15 DIAGNOSIS — R200 Anesthesia of skin: Secondary | ICD-10-CM | POA: Diagnosis not present

## 2022-09-16 ENCOUNTER — Telehealth: Payer: Self-pay | Admitting: Physical Medicine and Rehabilitation

## 2022-09-16 NOTE — Telephone Encounter (Signed)
Pt returned call to [facility]. Please call pt at [phone]

## 2022-09-16 NOTE — Telephone Encounter (Signed)
I called patient this morning to discuss recent cervical MRI imaging, I was unable to reach her. I did leave message to call us back. No significant findings noted on recent cervical MRI imaging, could consider one time injection, would recommend re-grouping with PT.

## 2022-12-10 ENCOUNTER — Other Ambulatory Visit: Payer: Self-pay

## 2022-12-10 DIAGNOSIS — Z3041 Encounter for surveillance of contraceptive pills: Secondary | ICD-10-CM

## 2022-12-10 MED ORDER — NORGESTIM-ETH ESTRAD TRIPHASIC 0.18/0.215/0.25 MG-25 MCG PO TABS: 1.0000 | ORAL_TABLET | Freq: Every day | ORAL | 0 refills | Status: DC

## 2022-12-10 NOTE — Telephone Encounter (Signed)
Pt calling to report she will need refill on BCPs before her scheduled appt on 01/20/2023.  Last mammo 05/04/2022-neg birads 1  Rx pending.

## 2022-12-16 DIAGNOSIS — M9905 Segmental and somatic dysfunction of pelvic region: Secondary | ICD-10-CM | POA: Diagnosis not present

## 2022-12-16 DIAGNOSIS — M25511 Pain in right shoulder: Secondary | ICD-10-CM | POA: Diagnosis not present

## 2022-12-16 DIAGNOSIS — M542 Cervicalgia: Secondary | ICD-10-CM | POA: Diagnosis not present

## 2022-12-16 DIAGNOSIS — M25512 Pain in left shoulder: Secondary | ICD-10-CM | POA: Diagnosis not present

## 2022-12-16 DIAGNOSIS — M9903 Segmental and somatic dysfunction of lumbar region: Secondary | ICD-10-CM | POA: Diagnosis not present

## 2022-12-16 DIAGNOSIS — Q7962 Hypermobile Ehlers-Danlos syndrome: Secondary | ICD-10-CM | POA: Diagnosis not present

## 2022-12-16 DIAGNOSIS — M9902 Segmental and somatic dysfunction of thoracic region: Secondary | ICD-10-CM | POA: Diagnosis not present

## 2022-12-16 DIAGNOSIS — M9901 Segmental and somatic dysfunction of cervical region: Secondary | ICD-10-CM | POA: Diagnosis not present

## 2023-01-12 ENCOUNTER — Ambulatory Visit: Payer: No Typology Code available for payment source | Admitting: Nurse Practitioner

## 2023-01-20 ENCOUNTER — Ambulatory Visit (INDEPENDENT_AMBULATORY_CARE_PROVIDER_SITE_OTHER): Payer: BC Managed Care – PPO | Admitting: Nurse Practitioner

## 2023-01-20 ENCOUNTER — Encounter: Payer: Self-pay | Admitting: Nurse Practitioner

## 2023-01-20 VITALS — BP 112/82 | HR 71 | Ht 66.75 in | Wt 154.0 lb

## 2023-01-20 DIAGNOSIS — Z01419 Encounter for gynecological examination (general) (routine) without abnormal findings: Secondary | ICD-10-CM

## 2023-01-20 DIAGNOSIS — Z803 Family history of malignant neoplasm of breast: Secondary | ICD-10-CM

## 2023-01-20 DIAGNOSIS — E559 Vitamin D deficiency, unspecified: Secondary | ICD-10-CM | POA: Diagnosis not present

## 2023-01-20 DIAGNOSIS — Z3041 Encounter for surveillance of contraceptive pills: Secondary | ICD-10-CM

## 2023-01-20 DIAGNOSIS — R5383 Other fatigue: Secondary | ICD-10-CM

## 2023-01-20 MED ORDER — NORGESTIM-ETH ESTRAD TRIPHASIC 0.18/0.215/0.25 MG-25 MCG PO TABS: 1.0000 | ORAL_TABLET | Freq: Every day | ORAL | 3 refills | Status: DC

## 2023-01-20 NOTE — Progress Notes (Signed)
Amber Ware May 15, 1981 [id number]   History:  42 y.o. G0 presents for annual exam. Complains of fatigue and just not feeling "good" for a few months. Monthly cycles. 2014 LEEP. Migraines managed by PCP. Family history of breast cancer in mother (age 74) and 2 maternal aunts (age 4 and 2). They decline genetic testing.   Gynecologic History Patient's last menstrual period was 01/13/2023 (approximate). Period Cycle (Days):  (28) Period Duration (Days): 4 Menstrual Flow:  (light to moderate, heavy sometimes.) Menstrual Control: Maxi pad Dysmenorrhea: None Contraception/Family planning: OCP (estrogen/progesterone) Sexually active: Yes  Health Maintenance Last Pap: 01/06/2022. Results were: Normal neg HPV, 3-year repeat Last mammogram: 05/04/2022. Results were: Normal Last colonoscopy: Not indicated Last Dexa: Not indicated  Past medical history, past surgical history, family history and social history were all reviewed and documented in the EPIC chart. Works for [employer] in [place].   ROS:  A ROS was performed and pertinent positives and negatives are included.  Exam:  Vitals:   01/20/23 1329  BP: 112/82  Pulse: 71  SpO2: 99%  Weight: 154 lb (69.9 kg)  Height: 5' 6.75" (1.695 m)   Body mass index is 24.3 kg/m.  General appearance:  Normal Thyroid:  Symmetrical, normal in size, without palpable masses or nodularity. Respiratory  Auscultation:  Clear without wheezing or rhonchi Cardiovascular  Auscultation:  Regular rate, without rubs, murmurs or gallops  Edema/varicosities:  Not grossly evident Abdominal  Soft,nontender, without masses, guarding or rebound.  Liver/spleen:  No organomegaly noted  Hernia:  None appreciated  Skin  Inspection:  Grossly normal Breasts: Examined lying and sitting.   Right: Without masses, retractions, nipple discharge or axillary adenopathy.   Left: Without masses, retractions, nipple discharge or axillary  adenopathy. Genitourinary   Inguinal/mons:  Normal without inguinal adenopathy  External genitalia:  Normal appearing vulva with no masses, tenderness, or lesions  BUS/Urethra/Skene's glands:  Normal  Vagina:  Normal appearing with normal color and discharge, no lesions  Cervix:  Normal appearing without discharge or lesions  Uterus:  Normal in size, shape and contour.  Midline and mobile, nontender  Adnexa/parametria:     Rt: Normal in size, without masses or tenderness.   Lt: Normal in size, without masses or tenderness.  Anus and perineum: Normal  Digital rectal exam: Not indicated  Patient informed chaperone available to be present for breast and pelvic exam. Patient has requested no chaperone to be present. Patient has been advised what will be completed during breast and pelvic exam.   Assessment/Plan:  42 y.o. G0 for annual exam.   Well female exam with routine gynecological exam - Plan: CBC with Differential/Platelet, Comprehensive metabolic panel. Education provided on SBEs, importance of preventative screenings, current guidelines, high calcium diet, regular exercise, and multivitamin daily.   Fatigue, unspecified type - Plan: CBC with Differential/Platelet, Comprehensive metabolic panel, Vitamin 123456, TSH, VITAMIN D 25 Hydroxy (Vit-D Deficiency, Fractures)  Family history of breast cancer - Plan: Ambulatory referral to Genetics. Mother diagnosed with breast cancer at age 2, 2 maternal aunts with breast cancer at age 71 and 51. She spoke with them about genetic testing but they decline. She is interested in pursuing.   Encounter for surveillance of contraceptive pills - Plan: Norgestimate-Ethinyl Estradiol Triphasic (ORTHO TRI-CYCLEN LO) 0.18/0.215/0.25 MG-25 MCG tab daily. Taking as prescribed. Refill x 1 year provided.   Screening for cervical cancer - 2014 LEEP. Will repeat at 3-year interval per guidelines.   Screening for breast cancer - Normal  mammogram history. Continue  annual screenings. Normal breast exam today.   Return in 1 year for annual.     [name] DNP, 1:40 PM 01/20/2023

## 2023-01-21 LAB — COMPREHENSIVE METABOLIC PANEL
AG Ratio: 1.6 (calc) (ref 1.0–2.5)
ALT: 11 U/L (ref 6–29)
AST: 13 U/L (ref 10–30)
Albumin: 4.4 g/dL (ref 3.6–5.1)
Alkaline phosphatase (APISO): 69 U/L (ref 31–125)
BUN: 18 mg/dL (ref 7–25)
CO2: 27 mmol/L (ref 20–32)
Calcium: 10.8 mg/dL — ABNORMAL HIGH (ref 8.6–10.2)
Chloride: 107 mmol/L (ref 98–110)
Creat: 0.73 mg/dL (ref 0.50–0.99)
Globulin: 2.8 g/dL (calc) (ref 1.9–3.7)
Glucose, Bld: 80 mg/dL (ref 65–99)
Potassium: 5.3 mmol/L (ref 3.5–5.3)
Sodium: 143 mmol/L (ref 135–146)
Total Bilirubin: 0.4 mg/dL (ref 0.2–1.2)
Total Protein: 7.2 g/dL (ref 6.1–8.1)

## 2023-01-21 LAB — VITAMIN B12: Vitamin B-12: 398 pg/mL (ref 200–1100)

## 2023-01-21 LAB — CBC WITH DIFFERENTIAL/PLATELET
Absolute Monocytes: 395 cells/uL (ref 200–950)
Basophils Absolute: 38 cells/uL (ref 0–200)
Basophils Relative: 0.5 %
Eosinophils Absolute: 289 cells/uL (ref 15–500)
Eosinophils Relative: 3.8 %
HCT: 37.8 % (ref 35.0–45.0)
Hemoglobin: 12.4 g/dL (ref 11.7–15.5)
Lymphs Abs: 2668 cells/uL (ref 850–3900)
MCH: 29.7 pg (ref 27.0–33.0)
MCHC: 32.8 g/dL (ref 32.0–36.0)
MCV: 90.6 fL (ref 80.0–100.0)
MPV: 8.6 fL (ref 7.5–12.5)
Monocytes Relative: 5.2 %
Neutro Abs: 4210 cells/uL (ref 1500–7800)
Neutrophils Relative %: 55.4 %
Platelets: 399 10*3/uL (ref 140–400)
RBC: 4.17 10*6/uL (ref 3.80–5.10)
RDW: 12 % (ref 11.0–15.0)
Total Lymphocyte: 35.1 %
WBC: 7.6 10*3/uL (ref 3.8–10.8)

## 2023-01-21 LAB — VITAMIN D 25 HYDROXY (VIT D DEFICIENCY, FRACTURES): Vit D, 25-Hydroxy: 28 ng/mL — ABNORMAL LOW (ref 30–100)

## 2023-01-21 LAB — TSH: TSH: 0.75 mIU/L

## 2023-02-11 ENCOUNTER — Ambulatory Visit (INDEPENDENT_AMBULATORY_CARE_PROVIDER_SITE_OTHER): Payer: BC Managed Care – PPO | Admitting: Nurse Practitioner

## 2023-02-11 ENCOUNTER — Encounter: Payer: Self-pay | Admitting: Nurse Practitioner

## 2023-02-11 VITALS — BP 126/80 | HR 68 | Temp 98.2°F | Resp 16 | Ht 67.0 in | Wt 152.8 lb

## 2023-02-11 DIAGNOSIS — F411 Generalized anxiety disorder: Secondary | ICD-10-CM

## 2023-02-11 DIAGNOSIS — F5101 Primary insomnia: Secondary | ICD-10-CM | POA: Diagnosis not present

## 2023-02-11 MED ORDER — TRAZODONE HCL 50 MG PO TABS: 25.0000 mg | ORAL_TABLET | Freq: Every day | ORAL | 5 refills | Status: DC

## 2023-02-11 NOTE — Assessment & Plan Note (Signed)
Onset 100yrs ago, difficulty falling asleep and staying alseep. Has racing thoughts at bedtime. Unable to sleep more than 4hrs a night Associated with Daytime fatigue. No electronic in her bedroom, no bed partner Coffee: 1x/week ETOH: no Soda: 2x/week She has tried Marijuana: daily 1joint, Melatonin 3-5mg , magnesium glycinate supplement, Unisom (caused AM headache): no improvement with sleep.  Start trazodone Advised to trying journaling Avoid laying in bed if unable to fall asleep within 1hr. F/up in 75month

## 2023-02-11 NOTE — Patient Instructions (Signed)
Insomnia Insomnia is a sleep disorder that makes it difficult to fall asleep or stay asleep. Insomnia can cause fatigue, low energy, difficulty concentrating, mood swings, and poor performance at work or school. There are three different ways to classify insomnia: Difficulty falling asleep. Difficulty staying asleep. Waking up too early in the morning. Any type of insomnia can be long-term (chronic) or short-term (acute). Both are common. Short-term insomnia usually lasts for 3 months or less. Chronic insomnia occurs at least three times a week for longer than 3 months. What are the causes? Insomnia may be caused by another condition, situation, or substance, such as: Having certain mental health conditions, such as anxiety and depression. Using caffeine, alcohol, tobacco, or drugs. Having gastrointestinal conditions, such as gastroesophageal reflux disease (GERD). Having certain medical conditions. These include: Asthma. Alzheimer's disease. Stroke. Chronic pain. An overactive thyroid gland (hyperthyroidism). Other sleep disorders, such as restless legs syndrome and sleep apnea. Menopause. Sometimes, the cause of insomnia may not be known. What increases the risk? Risk factors for insomnia include: Gender. Females are affected more often than males. Age. Insomnia is more common as people get older. Stress and certain medical and mental health conditions. Lack of exercise. Having an irregular work schedule. This may include working night shifts and traveling between different time zones. What are the signs or symptoms? If you have insomnia, the main symptom is having trouble falling asleep or having trouble staying asleep. This may lead to other symptoms, such as: Feeling tired or having low energy. Feeling nervous about going to sleep. Not feeling rested in the morning. Having trouble concentrating. Feeling irritable, anxious, or depressed. How is this diagnosed? This condition  may be diagnosed based on: Your symptoms and medical history. Your health care provider may ask about: Your sleep habits. Any medical conditions you have. Your mental health. A physical exam. How is this treated? Treatment for insomnia depends on the cause. Treatment may focus on treating an underlying condition that is causing the insomnia. Treatment may also include: Medicines to help you sleep. Counseling or therapy. Lifestyle adjustments to help you sleep better. Follow these instructions at home: Eating and drinking  Limit or avoid alcohol, caffeinated beverages, and products that contain nicotine and tobacco, especially close to bedtime. These can disrupt your sleep. Do not eat a large meal or eat spicy foods right before bedtime. This can lead to digestive discomfort that can make it hard for you to sleep. Sleep habits  Keep a sleep diary to help you and your health care provider figure out what could be causing your insomnia. Write down: When you sleep. When you wake up during the night. How well you sleep and how rested you feel the next day. Any side effects of medicines you are taking. What you eat and drink. Make your bedroom a dark, comfortable place where it is easy to fall asleep. Put up shades or blackout curtains to block light from outside. Use a white noise machine to block noise. Keep the temperature cool. Limit screen use before bedtime. This includes: Not watching TV. Not using your smartphone, tablet, or computer. Stick to a routine that includes going to bed and waking up at the same times every day and night. This can help you fall asleep faster. Consider making a quiet activity, such as reading, part of your nighttime routine. Try to avoid taking naps during the day so that you sleep better at night. Get out of bed if you are still awake after   15 minutes of trying to sleep. Keep the lights down, but try reading or doing a quiet activity. When you feel  sleepy, go back to bed. General instructions Take over-the-counter and prescription medicines only as told by your health care provider. Exercise regularly as told by your health care provider. However, avoid exercising in the hours right before bedtime. Use relaxation techniques to manage stress. Ask your health care provider to suggest some techniques that may work well for you. These may include: Breathing exercises. Routines to release muscle tension. Visualizing peaceful scenes. Make sure that you drive carefully. Do not drive if you feel very sleepy. Keep all follow-up visits. This is important. Contact a health care provider if: You are tired throughout the day. You have trouble in your daily routine due to sleepiness. You continue to have sleep problems, or your sleep problems get worse. Get help right away if: You have thoughts about hurting yourself or someone else. Get help right away if you feel like you may hurt yourself or others, or have thoughts about taking your own life. Go to your nearest emergency room or: Call 911. Call the National Suicide Prevention Lifeline at 1-800-273-8255 or 988. This is open 24 hours a day. Text the Crisis Text Line at 741741. Summary Insomnia is a sleep disorder that makes it difficult to fall asleep or stay asleep. Insomnia can be long-term (chronic) or short-term (acute). Treatment for insomnia depends on the cause. Treatment may focus on treating an underlying condition that is causing the insomnia. Keep a sleep diary to help you and your health care provider figure out what could be causing your insomnia. This information is not intended to replace advice given to you by your health care provider. Make sure you discuss any questions you have with your health care provider. Document Revised: 09/29/2021 Document Reviewed: 09/29/2021 Elsevier Patient Education  2023 Elsevier Inc.  

## 2023-02-11 NOTE — Assessment & Plan Note (Addendum)
Waxing and waning Affects quality to sleep. Start trazodone at hs F/up in 98month

## 2023-02-11 NOTE — Progress Notes (Signed)
Established Patient Visit  Patient: Amber Ware   DOB: [birth year]   42 y.o. Female  MRN: [id number] Visit Date: 02/11/2023  Subjective:    Chief Complaint  Patient presents with   Insomnia    Pt states" She is having trouble falling asleep and staying sleep.  She may get an average of 4 hours of sleep. Would like to discuss medication to help with sleep   HPI GAD (generalized anxiety disorder) Waxing and waning Affects quality to sleep. Start trazodone at hs F/up in 50month  Primary insomnia Onset 83yrs ago, difficulty falling asleep and staying alseep. Has racing thoughts at bedtime. Unable to sleep more than 4hrs a night Associated with Daytime fatigue. No electronic in her bedroom, no bed partner Coffee: 1x/week ETOH: no Soda: 2x/week She has tried Marijuana: daily 1joint, Melatonin 3-5mg , magnesium glycinate supplement, Unisom (caused AM headache): no improvement with sleep.  Start trazodone Advised to trying journaling Avoid laying in bed if unable to fall asleep within 1hr. F/up in 34month  BP Readings from Last 3 Encounters:  02/11/23 126/80  01/20/23 112/82  09/01/22 (!) 168/116    Wt Readings from Last 3 Encounters:  02/11/23 152 lb 12.8 oz (69.3 kg)  01/20/23 154 lb (69.9 kg)  01/06/22 152 lb (68.9 kg)       02/11/2023    9:31 AM 08/18/2019   10:26 AM 01/14/2018    2:29 PM  Depression screen PHQ 2/9  Decreased Interest 1 0 3  Down, Depressed, Hopeless 1 0 3  PHQ - 2 Score 2 0 6  Altered sleeping 3  1  Tired, decreased energy 2  2  Change in appetite 0  0  Feeling bad or failure about yourself  0  1  Trouble concentrating 1  2  Moving slowly or fidgety/restless 0  0  Suicidal thoughts 0  0  PHQ-9 Score 8  12  Difficult doing work/chores Very difficult         02/11/2023    9:32 AM 01/14/2018    2:30 PM  GAD 7 : Generalized Anxiety Score  Nervous, Anxious, on Edge 2 1  Control/stop worrying 2 0  Worry too much - different  things 2 0  Trouble relaxing 2 1  Restless 1 0  Easily annoyed or irritable 3 3  Afraid - awful might happen 0 0  Total GAD 7 Score 12 5  Anxiety Difficulty Very difficult    Reviewed medical, surgical, and social history today  Medications: Outpatient Medications Prior to Visit  Medication Sig   Norgestimate-Ethinyl Estradiol Triphasic (ORTHO TRI-CYCLEN LO) 0.18/0.215/0.25 MG-25 MCG tab Take 1 tablet by mouth daily.   No facility-administered medications prior to visit.   Reviewed past medical and social history.   ROS per HPI above  Last CBC Lab Results  Component Value Date   WBC 7.6 01/20/2023   HGB 12.4 01/20/2023   HCT 37.8 01/20/2023   MCV 90.6 01/20/2023   MCH 29.7 01/20/2023   RDW 12.0 01/20/2023   PLT 399 01/20/2023   Last metabolic panel Lab Results  Component Value Date   GLUCOSE 80 01/20/2023   NA 143 01/20/2023   K 5.3 01/20/2023   CL 107 01/20/2023   CO2 27 01/20/2023   BUN 18 01/20/2023   CREATININE 0.73 01/20/2023   CALCIUM 10.8 (H) 01/20/2023   PROT 7.2 01/20/2023   ALBUMIN 4.5 08/18/2019   BILITOT  0.4 01/20/2023   ALKPHOS 65 08/18/2019   AST 13 01/20/2023   ALT 11 01/20/2023   Last hemoglobin A1c Lab Results  Component Value Date   HGBA1C 5.2 11/09/2016   Last thyroid functions Lab Results  Component Value Date   TSH 0.75 01/20/2023   Last vitamin D Lab Results  Component Value Date   VD25OH 28 (L) 01/20/2023        Objective:  BP 126/80   Pulse 68   Temp 98.2 F (36.8 C) (Temporal)   Resp 16   Ht 5\' 7"  (1.702 m)   Wt 152 lb 12.8 oz (69.3 kg)   LMP 01/13/2023 (Approximate)   SpO2 100%   BMI 23.93 kg/m      Physical Exam Vitals reviewed.  Cardiovascular:     Rate and Rhythm: Normal rate.     Pulses: Normal pulses.  Pulmonary:     Effort: Pulmonary effort is normal.  Neurological:     Mental Status: She is alert and oriented to person, place, and time.  Psychiatric:        Mood and Affect: Mood normal.         Behavior: Behavior normal.        Thought Content: Thought content normal.     No results found for any visits on 02/11/23.    Assessment & Plan:    Problem List Items Addressed This Visit       Other   GAD (generalized anxiety disorder)    Waxing and waning Affects quality to sleep. Start trazodone at hs F/up in 50month      Relevant Medications   traZODone (DESYREL) 50 MG tablet   Primary insomnia - Primary    Onset 78yrs ago, difficulty falling asleep and staying alseep. Has racing thoughts at bedtime. Unable to sleep more than 4hrs a night Associated with Daytime fatigue. No electronic in her bedroom, no bed partner Coffee: 1x/week ETOH: no Soda: 2x/week She has tried Marijuana: daily 1joint, Melatonin 3-5mg , magnesium glycinate supplement, Unisom (caused AM headache): no improvement with sleep.  Start trazodone Advised to trying journaling Avoid laying in bed if unable to fall asleep within 1hr. F/up in 61month      Relevant Medications   traZODone (DESYREL) 50 MG tablet   Return in about 4 weeks (around 03/11/2023) for insomnia.     Alysia Penna, NP

## 2023-02-15 ENCOUNTER — Ambulatory Visit: Payer: BC Managed Care – PPO | Admitting: Nurse Practitioner

## 2023-02-15 DIAGNOSIS — M542 Cervicalgia: Secondary | ICD-10-CM | POA: Diagnosis not present

## 2023-02-15 DIAGNOSIS — M25512 Pain in left shoulder: Secondary | ICD-10-CM | POA: Diagnosis not present

## 2023-02-15 DIAGNOSIS — M25511 Pain in right shoulder: Secondary | ICD-10-CM | POA: Diagnosis not present

## 2023-02-15 DIAGNOSIS — M9901 Segmental and somatic dysfunction of cervical region: Secondary | ICD-10-CM | POA: Diagnosis not present

## 2023-02-19 ENCOUNTER — Other Ambulatory Visit: Payer: Self-pay | Admitting: Nurse Practitioner

## 2023-02-19 DIAGNOSIS — F5101 Primary insomnia: Secondary | ICD-10-CM

## 2023-02-19 DIAGNOSIS — F411 Generalized anxiety disorder: Secondary | ICD-10-CM

## 2023-02-22 ENCOUNTER — Other Ambulatory Visit: Payer: Self-pay

## 2023-02-25 ENCOUNTER — Telehealth: Payer: Self-pay | Admitting: Nurse Practitioner

## 2023-02-25 NOTE — Telephone Encounter (Signed)
error 

## 2023-02-25 NOTE — Telephone Encounter (Signed)
Pt would like for you to give her a call she have questions about traZODone (DESYREL) 50 MG tablet [[id number]]

## 2023-02-25 NOTE — Telephone Encounter (Signed)
Left message to return call 

## 2023-03-15 ENCOUNTER — Ambulatory Visit: Payer: BC Managed Care – PPO | Admitting: Nurse Practitioner

## 2023-03-23 ENCOUNTER — Other Ambulatory Visit: Payer: Self-pay | Admitting: Nurse Practitioner

## 2023-03-23 DIAGNOSIS — Z0000 Encounter for general adult medical examination without abnormal findings: Secondary | ICD-10-CM

## 2023-05-07 ENCOUNTER — Ambulatory Visit: Payer: BC Managed Care – PPO

## 2023-06-03 ENCOUNTER — Ambulatory Visit: Payer: BC Managed Care – PPO

## 2023-08-03 IMAGING — DX DG SHOULDER 2+V*R*
3 series · 3 of 3 positions shown · non-contrast
Comparison: None.

CLINICAL DATA: Chronic shoulder pain.

EXAM:
RIGHT SHOULDER - 2+ VIEW

[shoulder grashey]
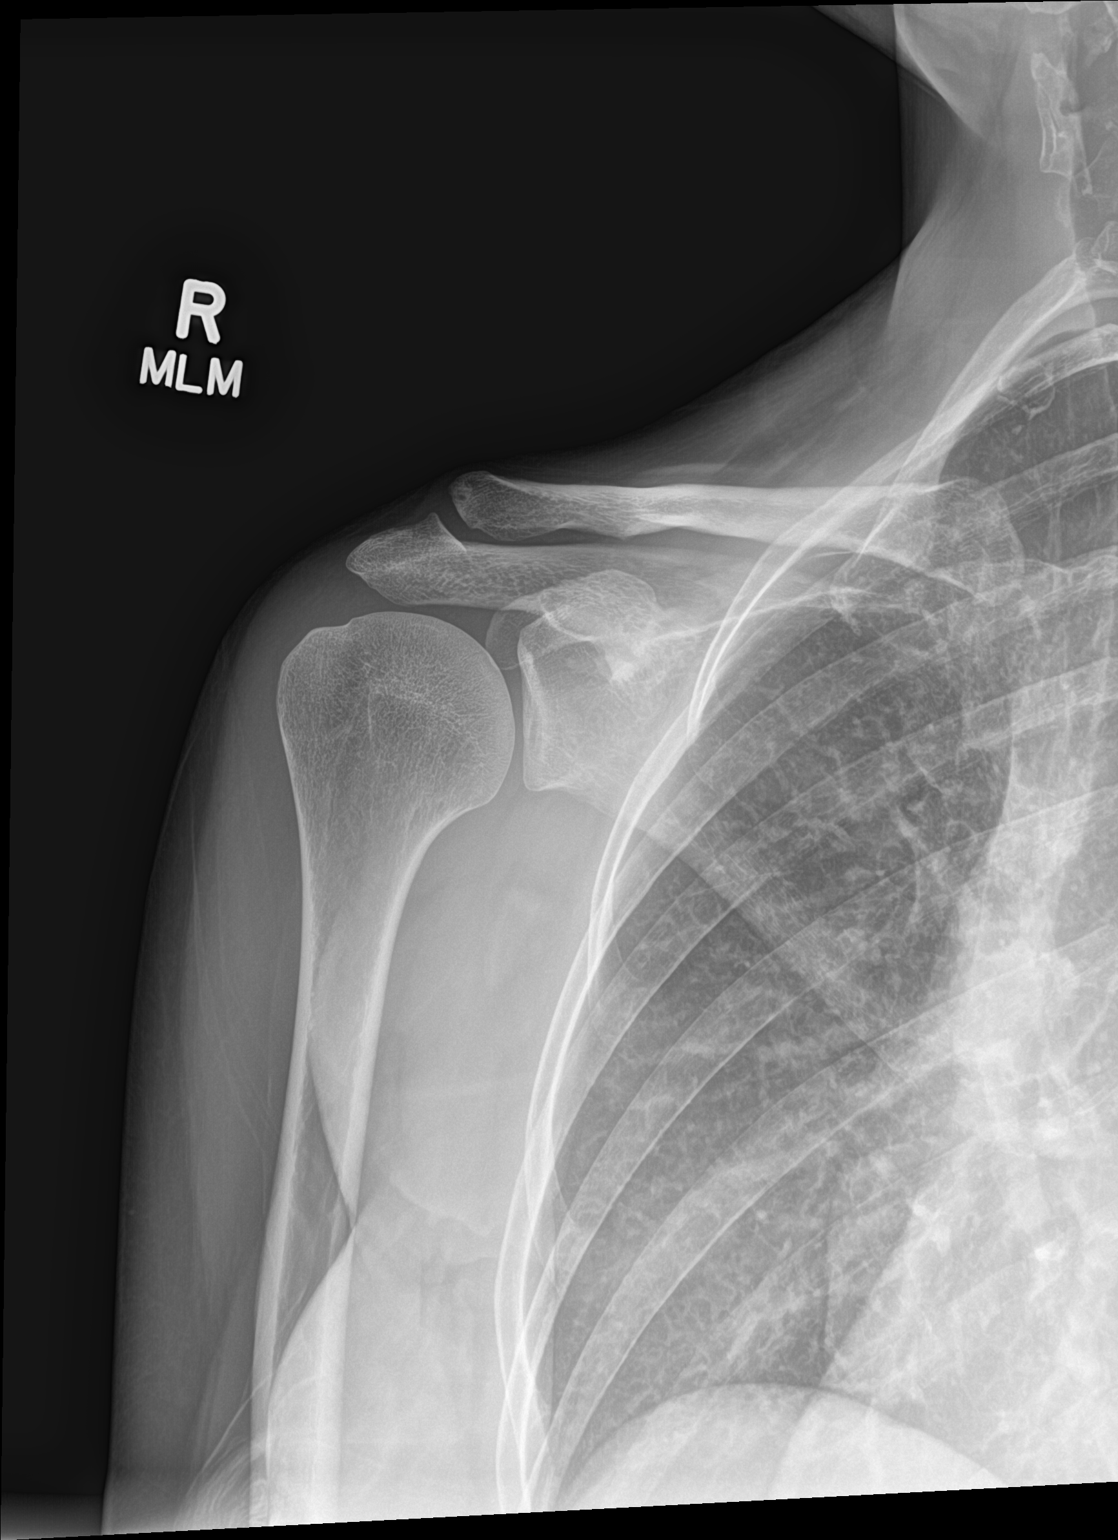

[shoulder y view]
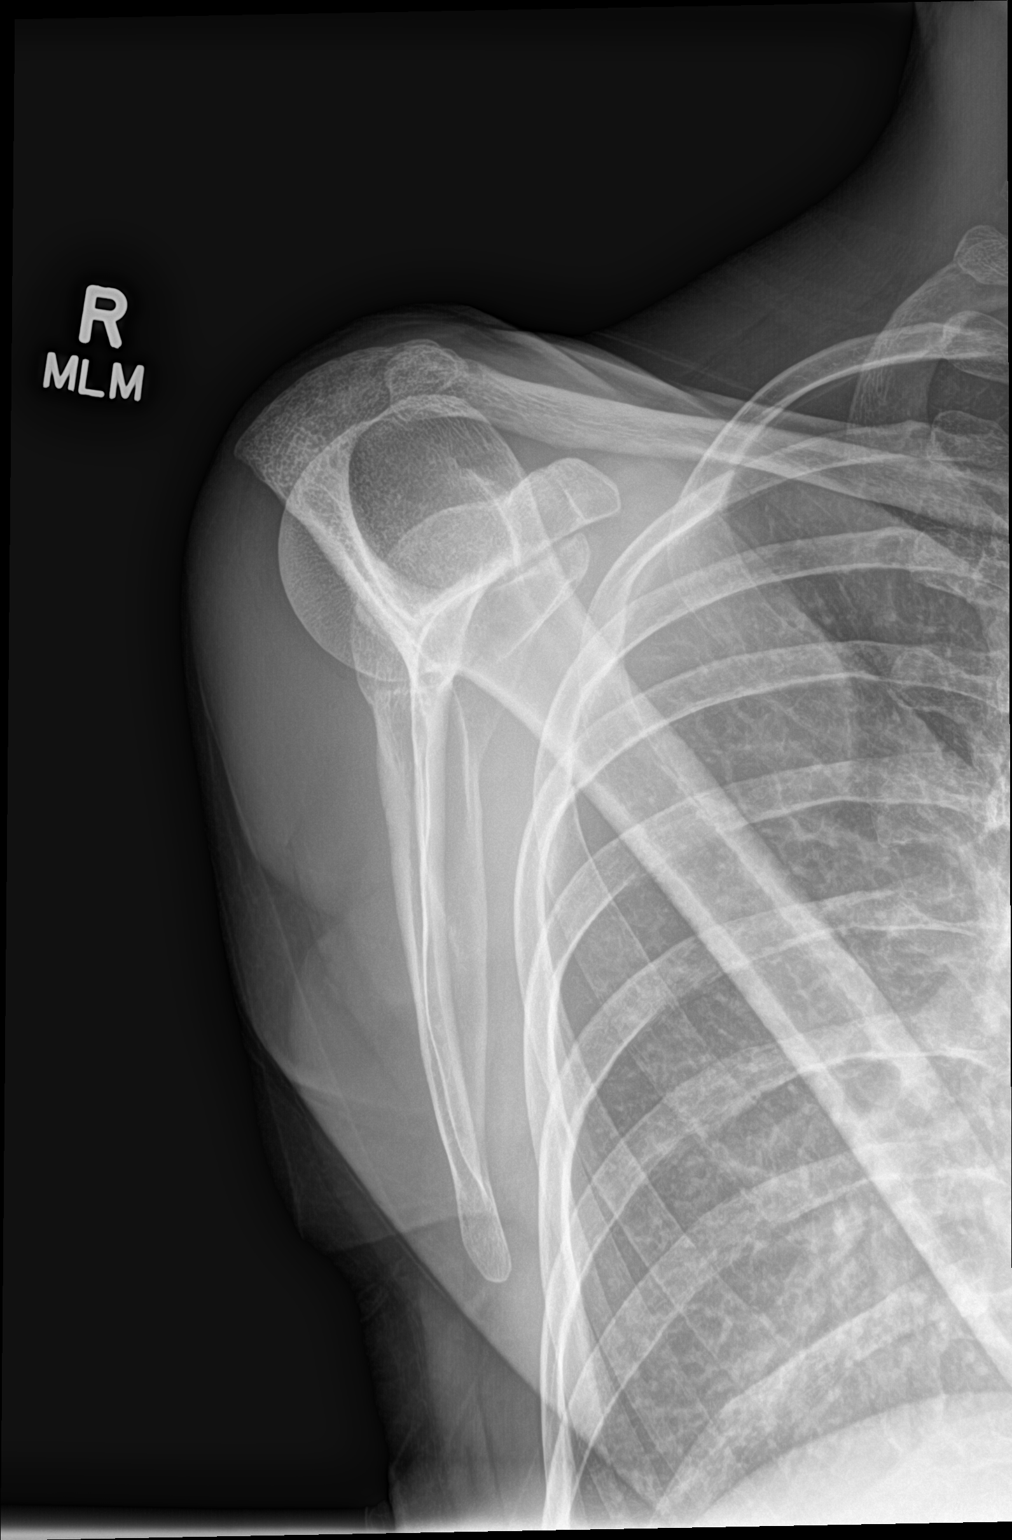

[shoulder axillary]
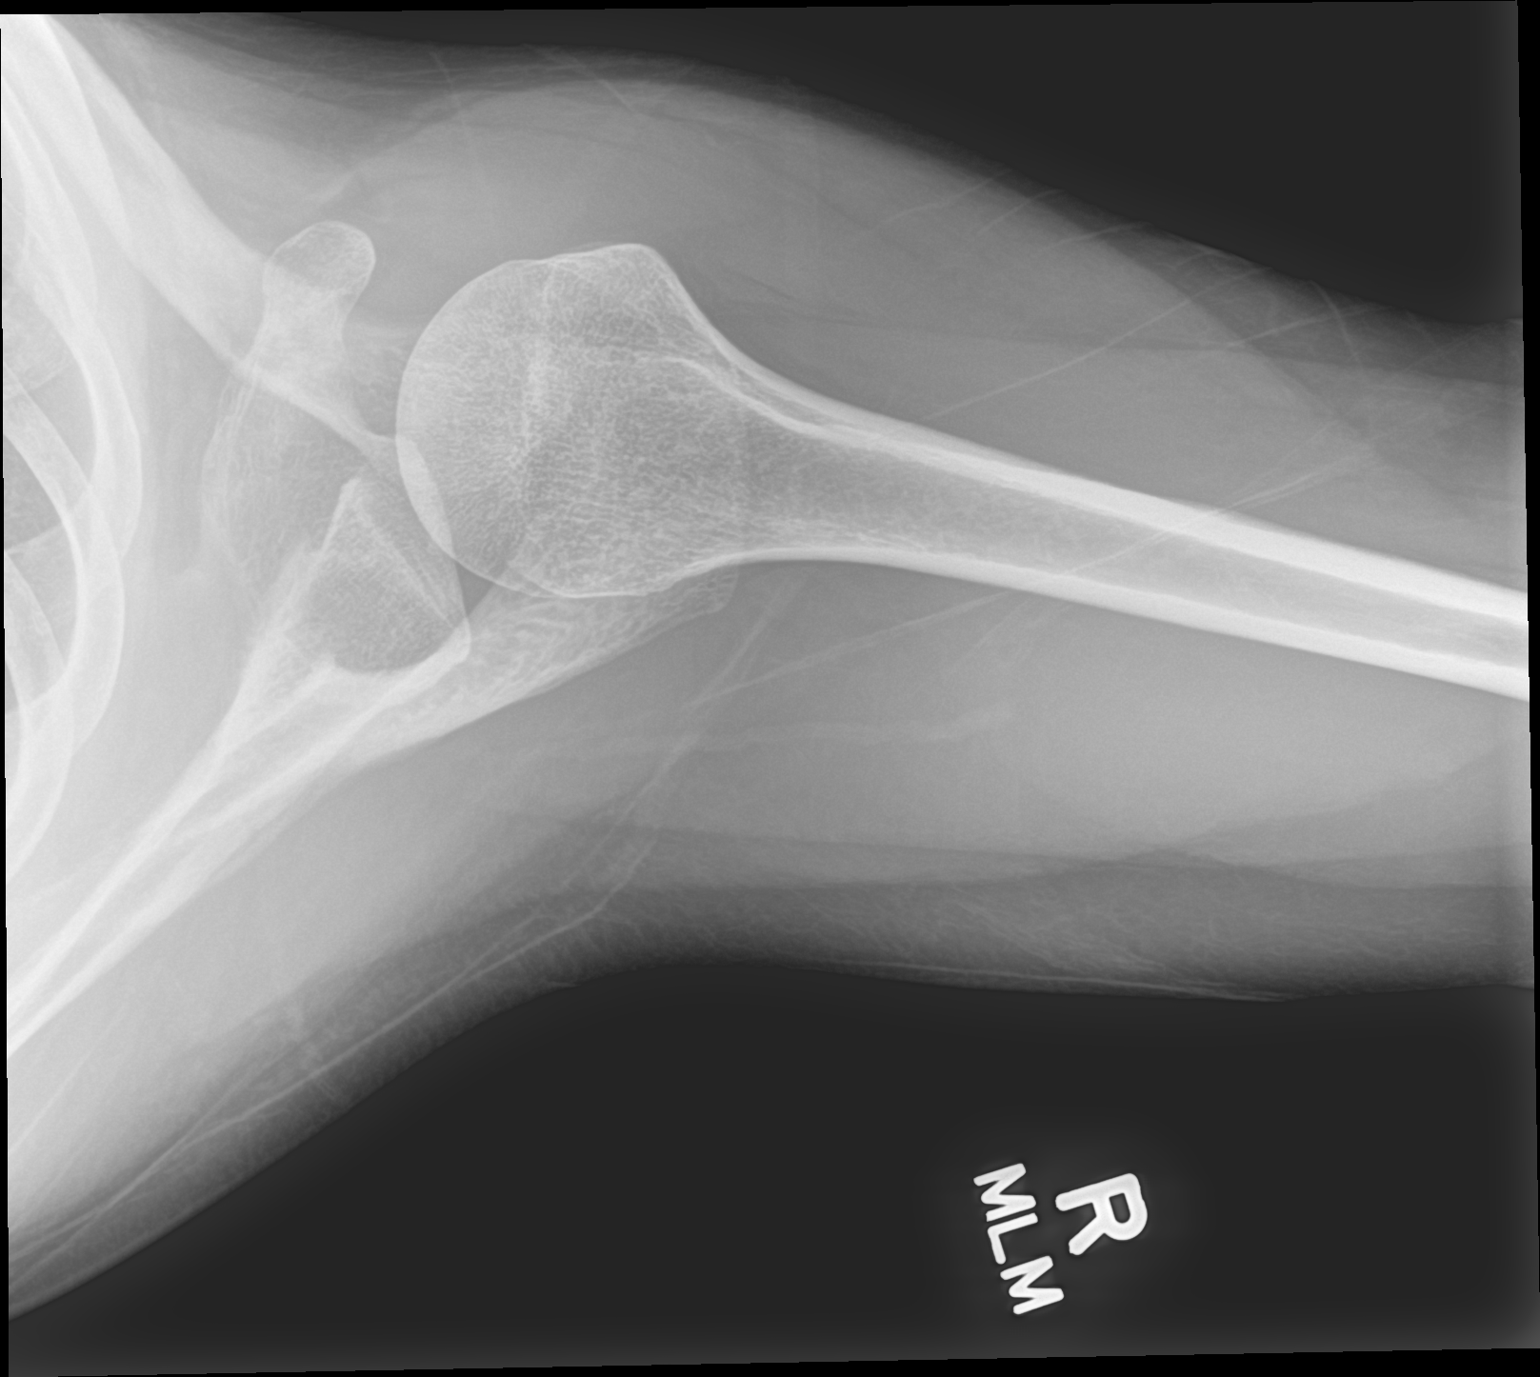

[3 of 3 positions shown; findings below may reference images not displayed]

FINDINGS: There is no evidence of fracture or dislocation. There is no
evidence of arthropathy or other focal bone abnormality. Soft
tissues are unremarkable.
IMPRESSION: Negative.

## 2023-08-30 ENCOUNTER — Other Ambulatory Visit: Payer: Self-pay | Admitting: Obstetrics and Gynecology

## 2023-08-30 DIAGNOSIS — Z1231 Encounter for screening mammogram for malignant neoplasm of breast: Secondary | ICD-10-CM

## 2023-10-21 ENCOUNTER — Ambulatory Visit: Payer: Self-pay

## 2023-11-25 ENCOUNTER — Ambulatory Visit
Admission: RE | Admit: 2023-11-25 | Discharge: 2023-11-25 | Disposition: A | Discharge status: Home / Self Care | Payer: Medicaid Other | Source: Ambulatory Visit | Attending: Obstetrics and Gynecology | Admitting: Obstetrics and Gynecology

## 2023-11-25 ENCOUNTER — Ambulatory Visit: Payer: Medicaid Other

## 2023-11-25 DIAGNOSIS — Z0000 Encounter for general adult medical examination without abnormal findings: Secondary | ICD-10-CM

## 2023-11-25 DIAGNOSIS — Z1231 Encounter for screening mammogram for malignant neoplasm of breast: Secondary | ICD-10-CM | POA: Diagnosis not present

## 2024-01-20 ENCOUNTER — Encounter: Payer: Self-pay | Admitting: Nurse Practitioner

## 2024-01-20 ENCOUNTER — Ambulatory Visit (INDEPENDENT_AMBULATORY_CARE_PROVIDER_SITE_OTHER): Payer: BC Managed Care – PPO | Admitting: Nurse Practitioner

## 2024-01-20 VITALS — BP 112/72 | HR 77 | Ht 66.5 in | Wt 161.0 lb

## 2024-01-20 DIAGNOSIS — Z3041 Encounter for surveillance of contraceptive pills: Secondary | ICD-10-CM

## 2024-01-20 DIAGNOSIS — G43709 Chronic migraine without aura, not intractable, without status migrainosus: Secondary | ICD-10-CM | POA: Diagnosis not present

## 2024-01-20 DIAGNOSIS — N898 Other specified noninflammatory disorders of vagina: Secondary | ICD-10-CM | POA: Diagnosis not present

## 2024-01-20 DIAGNOSIS — Z01419 Encounter for gynecological examination (general) (routine) without abnormal findings: Secondary | ICD-10-CM | POA: Diagnosis not present

## 2024-01-20 LAB — WET PREP FOR TRICH, YEAST, CLUE

## 2024-01-20 MED ORDER — NORETHIN ACE-ETH ESTRAD-FE 1-20 MG-MCG PO TABS: 1.0000 | ORAL_TABLET | Freq: Every day | ORAL | 3 refills | Status: AC

## 2024-01-20 NOTE — Progress Notes (Signed)
 Tomeshia Stiggers October 10, 1981 [id number]   History:  43 y.o. G0 presents for annual exam. Monthly cycles. From June-December had migraines monthly that seemed to occur right before or during her period. They were debilitating. H/O migraines, managed by PCP. 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 Has tried acupuncture, chiropractor, and more with little relief. No migraine the last few months. Ibuprofen helps sometimes. 2014 LEEP. Family history of breast cancer in mother (age 38) and 2 maternal aunts (age 3 and 44). They decline genetic testing. Genetics referral sent last year patient but decided not to pursue. Complains of intermittent white, clumpy vaginal discharge. No itching or odor.   Gynecologic History Patient's last menstrual period was 01/14/2024 (approximate). Period Duration (Days): 2-4 Period Pattern: Regular Menstrual Flow: Light Menstrual Control: Panty liner Dysmenorrhea: (!) Mild Dysmenorrhea Symptoms: Headache Contraception/Family planning: OCP (estrogen/progesterone) Sexually active: Yes  Health Maintenance Last Pap: 01/06/2022. Results were: Normal neg HPV, 3-year repeat Last mammogram: 11/25/2023. Results were: Normal Last colonoscopy: Not indicated Last Dexa: Not indicated Exercising: Yes. The patient has a physically strenuous job, but has no regular exercise apart from work Smoker: no   Past medical history, past surgical history, family history and social history were all reviewed and documented in the EPIC chart. Works for [employer] in [place].   ROS:  A ROS was performed and pertinent positives and negatives are included.  Exam:  Vitals:   01/20/24 1430  BP: 112/72  Pulse: 77  SpO2: 99%  Weight: 161 lb (73 kg)  Height: 5' 6.5" (1.689 m)    Body mass index is 25.6 kg/m.  General appearance:  Normal Thyroid:  Symmetrical, normal in  size, without palpable masses or nodularity. Respiratory  Auscultation:  Clear without wheezing or rhonchi Cardiovascular  Auscultation:  Regular rate, without rubs, murmurs or gallops  Edema/varicosities:  Not grossly evident Abdominal  Soft,nontender, without masses, guarding or rebound.  Liver/spleen:  No organomegaly noted  Hernia:  None appreciated  Skin  Inspection:  Grossly normal Breasts: Examined lying and sitting.   Right: Without masses, retractions, nipple discharge or axillary adenopathy.   Left: Without masses, retractions, nipple discharge or axillary adenopathy. Pelvic: External genitalia:  no lesions              Urethra:  normal appearing urethra with no masses, tenderness or lesions              Bartholins and Skenes: normal                 Vagina: normal appearing vagina with normal color and discharge, no lesions              Cervix: no lesions Bimanual Exam:  Uterus:  no masses or tenderness              Adnexa: no mass, fullness, tenderness              Rectovaginal: Deferred              Anus:  normal, no lesions  Patient informed chaperone available to be present for breast and pelvic exam. Patient has requested no chaperone to be present. Patient has been advised what will be completed during breast and pelvic exam.   Wet prep negative for pathogens  Assessment/Plan:  43 y.o. G0 for annual exam.   Well female exam with routine gynecological exam - Education  provided on SBEs, importance of preventative screenings, current guidelines, high calcium diet, regular exercise, and multivitamin daily.   Encounter for surveillance of contraceptive pills - Plan: norethindrone-ethinyl estradiol-FE (LOESTRIN FE) 1-20 MG-MCG tablet daily. Aware of proper use.   Vaginal discharge - Plan: WET PREP FOR TRICH, YEAST, CLUE. Negative wet prep.   Chronic migraine without aura without status migrainosus, not intractable - Plan: Ambulatory referral to Neurology. Seems to be  cyclic sometimes but not always. Will try COCs continuously to see if this helps. Establish with neuro.   Screening for cervical cancer - 2014 LEEP. Will repeat at 3-year interval per guidelines.   Screening for breast cancer - Normal mammogram history. Continue annual screenings. Normal breast exam today.   Return in about 1 year (around 01/19/2025) for Annual.    Olivia Mackie DNP, 2:36 PM 01/20/2024

## 2024-01-21 ENCOUNTER — Encounter: Payer: Self-pay | Admitting: Neurology

## 2024-02-24 NOTE — Progress Notes (Signed)
 Initial neurology clinic note  Amber Ware MRN: [id number] DOB: 07-09-1981  Referring provider: Andee Bamberger, NP  Primary care provider: Kandace Organ, NP  Reason for consult:  headaches  Subjective:  This is Ms. Amber Ware, a 43 y.o. left-handed female with a medical history of PCOS who presents to neurology clinic with headaches. The patient is alone today.  Patient's headaches started about 4-5 years ago. She has pain in the right forehead which she describes as a golf ball trying to come out of her head. She also has pain in the right temple that feels like a spike. She also gets pain in the base of her right head and neck that feels tight and burning type pain. With severe headaches, she will have associated photophobia, phonophobia, and nausea and vomiting. She has a headache every day, but the severity differs. When severe, she rates the pain 9-10/10. She gets this severe headache 1-2 times per month. The less severe, more daily headaches are 5-6/10. When able, she will lay in a dark room and try to sleep off symptoms. She gets blurry vision, but denies vision loss or clear aura.  She has significant neck and trap tightness. She was working in a physical job and had to quit those due to headaches (about 2.5 years ago). She was way worse back then. Her symptoms described above are 80% better than prior. She has done PT x3 and dry needling in the past (last dry needling was 3 years ago, PT about 2 years ago). She is not sure this helped. She still does home exercises some, but none have been done since stopping the physical work.  She has very poor sleep. She tosses and turns throughout the night due to pinching and burning and pain in the shoulders. She will sometimes wake with a headache as well. She will put a balm on her shoulders for this.  She has tried ibuprofen, but it only works for headaches 50% of the time. She will take this 2-3 times per week. She  has tried other people's gabapentin, that can help her sleep, but not with pain.  She has tried Sumatriptan  in the past, but this did not help. She would wait to see if the pain got worse though before taking. She has taken muscle relaxers but she did not feel well on them. She has never been on a preventative medication. She takes magnesium and vit D supplements.  Smoker: No OCP use: Loestrin (does have low dose estrogen) Caffiene use: 1 cup of coffee of week EtOH use: None Restrictive diet: No Family history of neurologic disease, including: Mother with migraines   MEDICATIONS:  Outpatient Encounter Medications as of 03/09/2024  Medication Sig   cholecalciferol (VITAMIN D3) 25 MCG (1000 UNIT) tablet Take 1,000 Units by mouth daily.   magnesium 30 MG tablet Take 250 mg by mouth once.   norethindrone-ethinyl estradiol -FE (LOESTRIN FE) 1-20 MG-MCG tablet Take 1 tablet by mouth daily. Take ACTIVE pills only, skipping PLACEBO pills   [DISCONTINUED] traZODone  (DESYREL ) 50 MG tablet Take 0.5 tablets (25 mg total) by mouth at bedtime. May increase dose up to 1-2tabs if needed.   No facility-administered encounter medications on file as of 03/09/2024.    PAST MEDICAL HISTORY: Past Medical History:  Diagnosis Date   Abnormal Pap smear of cervix 2014   Polycystic ovarian syndrome     PAST SURGICAL HISTORY: Past Surgical History:  Procedure Laterality Date   CERVICAL BIOPSY  W/  LOOP ELECTRODE EXCISION  2014   COLPOSCOPY  2014    ALLERGIES: No Known Allergies  FAMILY HISTORY: Family History  Problem Relation Age of Onset   Breast cancer Mother 55       still alive   Thyroid  disease Mother    Hypertension Father    Breast cancer Maternal Aunt 44   Breast cancer Maternal Aunt 8    SOCIAL HISTORY: Social History   Tobacco Use   Smoking status: Former    Types: Cigarettes   Smokeless tobacco: Never  Vaping Use   Vaping status: Never Used  Substance Use Topics   Alcohol use:  No   Drug use: Yes    Types: Marijuana    Comment: at night to sleep   Social History   Social History Narrative   Are you right handed or left handed? left   Are you currently employed ?    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    Who lives with you? boyfriend   What type of home do you live in: 1 story or 2 story? one    Caffiene rarely    Objective:  Vital Signs:  BP 130/89   Pulse 66   Ht 5' 6.5" (1.689 m)   Wt 158 lb (71.7 kg)   SpO2 100%   BMI 25.12 kg/m   General: No acute distress.  Patient appears well-groomed.   Head:  Normocephalic/atraumatic Eyes:  fundi examined, disc margins clear, no obviously Neck: supple, positive for paraspinal/trapezius tenderness (R > L), decrease range of motion when looking to left Heart: regular rate and rhythm Lungs: Clear to auscultation bilaterally. Vascular: No carotid bruits.  Neurological Exam: Mental status: alert and oriented, speech fluent and not dysarthric, language intact.  Cranial nerves: CN I: not tested CN II: pupils equal, round and reactive to light, visual fields intact CN III, IV, VI:  full range of motion, no nystagmus, no ptosis CN V: facial sensation intact. CN VII: upper and lower face symmetric CN VIII: hearing intact CN IX, X: uvula midline CN XI: sternocleidomastoid and trapezius muscles intact CN XII: tongue midline  Bulk & Tone: normal, no fasciculations. Motor:  muscle strength 5/5 throughout Deep Tendon Reflexes:  2+ throughout.   Sensation:  Light touch sensation intact. Finger to nose testing:  Without dysmetria.    Gait:  Normal station and stride.  Romberg negative.   Labs and Imaging review: Internal labs: 01/20/23: Vit D low at 28 TSH wnl B12: 398 CMP unremarkable CBC w/ diff unremarkable  Imaging/Procedures: MRI cervical spine wo contrast (09/15/22): IMPRESSION: No neural compressive finding in the cervical region. Minimal annular bulging at  C5-6 without compressive narrowing of the canal or foramina. Minimal uncovertebral prominence at C6-7 without compressive stenosis.  Assessment/Plan:  Amber Ware is a 43 y.o. female who presents for evaluation of headaches and neck pain/tightness. She has a relevant medical history of PCOS. Her neurological examination is normal today. She does have very tight and tender trapezius and cervical paraspinal muscles on general examination. Available diagnostic data is significant for normal TSH and B12 and low vit D. Patient's headaches are consistent with chronic migraine without aura. She has daily headaches (about 30 per month) with one very severe headache monthly. She also likely has contributions from cervicalgia. She has tried PT in the remote past, but also had a very physical job at the time that may have contributed to her not improving.  She is willing to try again as her neck pain and tightness is debilitating.  PLAN: -Continue Vit D supplementation -Physical therapy for cervicalgia -For migraines: Migraine prevention:  Start nortriptyline  10 mg at bedtime for 1 week, 20 mg at bedtime thereafter Migraine rescue:  Start Maxalt  10 mg as needed at headache onset, can repeat after 2 hours. Zofran  4 mg ODT as needed for nausea Limit use of pain relievers to no more than 2 days out of week to prevent risk of rebound or medication-overuse headache. Keep headache diary  -Return to clinic in 3 months  The impression above as well as the plan as outlined below were extensively discussed with the patient who voiced understanding. All questions were answered to their satisfaction.  When available, results of the above investigations and possible further recommendations will be communicated to the patient via telephone/MyChart. Patient to call office if not contacted after expected testing turnaround time.   Total time spent reviewing records, interview, history/exam, documentation, and  coordination of care on day of encounter:  60 min   Thank you for allowing me to participate in patient's care.  If I can answer any additional questions, I would be pleased to do so.  Amber Coats, MD   CC: Nche, Connye Delaine, NP [address] [place] [address]  CC: Referring provider: Andee Bamberger, NP [address] [place],  [place] [address]

## 2024-03-09 ENCOUNTER — Ambulatory Visit: Admitting: Neurology

## 2024-03-09 ENCOUNTER — Encounter: Payer: Self-pay | Admitting: Neurology

## 2024-03-09 VITALS — BP 130/89 | HR 66 | Ht 66.5 in | Wt 158.0 lb

## 2024-03-09 DIAGNOSIS — R112 Nausea with vomiting, unspecified: Secondary | ICD-10-CM

## 2024-03-09 DIAGNOSIS — M542 Cervicalgia: Secondary | ICD-10-CM

## 2024-03-09 DIAGNOSIS — H53149 Visual discomfort, unspecified: Secondary | ICD-10-CM | POA: Diagnosis not present

## 2024-03-09 DIAGNOSIS — E559 Vitamin D deficiency, unspecified: Secondary | ICD-10-CM

## 2024-03-09 DIAGNOSIS — F40298 Other specified phobia: Secondary | ICD-10-CM

## 2024-03-09 DIAGNOSIS — G43709 Chronic migraine without aura, not intractable, without status migrainosus: Secondary | ICD-10-CM | POA: Diagnosis not present

## 2024-03-09 MED ORDER — NORTRIPTYLINE HCL 10 MG PO CAPS: 20.0000 mg | ORAL_CAPSULE | Freq: Every day | ORAL | 5 refills | Status: DC

## 2024-03-09 MED ORDER — ONDANSETRON 4 MG PO TBDP: 4.0000 mg | ORAL_TABLET | Freq: Three times a day (TID) | ORAL | 3 refills | Status: AC | PRN

## 2024-03-09 MED ORDER — RIZATRIPTAN BENZOATE 10 MG PO TABS: 10.0000 mg | ORAL_TABLET | ORAL | 5 refills | Status: AC | PRN

## 2024-03-09 NOTE — Patient Instructions (Addendum)
 I saw you today for headaches. I think you have migraines and contributions from your very tight and sore neck.  I would like to do the following to help: -Physical therapy for neck pain -For migraines: Migraine prevention:  Start nortriptyline 10 mg at bedtime for 1 week, 20 mg at bedtime thereafter Migraine rescue:  Start Maxalt 10 mg as needed at headache onset, can repeat after 2 hours. Zofran 4 mg under the tongue as needed for nausea Limit use of pain relievers to no more than 2 days out of week to prevent risk of rebound or medication-overuse headache. Keep headache diary  I will see you back in clinic in about 3 months to check your progress. Please let me know if you have any questions or concerns in the meantime.   The physicians and staff at [facility] are committed to providing excellent care. You may receive a survey requesting feedback about your experience at our office. We strive to receive "very good" responses to the survey questions. If you feel that your experience would prevent you from giving the office a "very good " response, please contact our office to try to remedy the situation. We may be reached at [phone]. Thank you for taking the time out of your busy day to complete the survey.  Rommie Coats, MD [facility]  More migraine information: Be aware of common food triggers:  - Caffeine:  coffee, black tea, cola, Mt. Dew  - Chocolate  - Dairy:  aged cheeses (brie, blue, cheddar, gouda, Parmasan, provolone, romano, Swiss, etc), chocolate milk, buttermilk, sour cream, limit eggs and yogurt  - Nuts, peanut butter  - Alcohol  - Cereals/grains:  FRESH breads (fresh bagels, sourdough, doughnuts), yeast productions  - Processed/canned/aged/cured meats (pre-packaged deli meats, hotdogs)  - MSG/glutamate:  soy sauce, flavor enhancer, pickled/preserved/marinated foods  - Sweeteners:  aspartame (Equal, Nutrasweet).  Sugar and Splenda are okay  - Vegetables:   legumes (lima beans, lentils, snow peas, fava beans, pinto peans, peas, garbanzo beans), sauerkraut, onions, olives, pickles  - Fruit:  avocados, bananas, citrus fruit (orange, lemon, grapefruit), mango  - Other:  Frozen meals, macaroni and cheese Routine exercise Stay adequately hydrated (aim for 64 oz water daily) Keep headache diary Maintain proper stress management Maintain proper sleep hygiene Do not skip meals Consider supplements:  magnesium citrate 400mg  daily, riboflavin 400mg  daily, coenzyme Q10 100mg  three times daily.

## 2024-03-24 ENCOUNTER — Other Ambulatory Visit: Payer: Self-pay

## 2024-03-24 ENCOUNTER — Ambulatory Visit: Attending: Neurology

## 2024-03-24 DIAGNOSIS — M542 Cervicalgia: Secondary | ICD-10-CM | POA: Insufficient documentation

## 2024-03-24 DIAGNOSIS — M6281 Muscle weakness (generalized): Secondary | ICD-10-CM | POA: Diagnosis present

## 2024-03-24 DIAGNOSIS — R293 Abnormal posture: Secondary | ICD-10-CM | POA: Diagnosis present

## 2024-03-24 NOTE — Therapy (Signed)
 OUTPATIENT PHYSICAL THERAPY CERVICAL EVALUATION   Patient Name: Amber Ware MRN: [id number] DOB:[birth year], 43 y.o., female Today's Date: 03/24/2024  END OF SESSION:  PT End of Session - 03/24/24 0850     Visit Number 1    Number of Visits 17    Date for PT Re-Evaluation 05/19/24    Authorization Type [employer]    PT Start Time 0845    PT Stop Time 0925    PT Time Calculation (min) 40 min             Past Medical History:  Diagnosis Date   Abnormal Pap smear of cervix 2014   Polycystic ovarian syndrome    Past Surgical History:  Procedure Laterality Date   CERVICAL BIOPSY  W/ LOOP ELECTRODE EXCISION  2014   COLPOSCOPY  2014   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   Liver masses 08/24/2019   Varicose veins of bilateral lower extremities with other complications 01/18/2018   Spider veins of limb 01/18/2018   GAD (generalized anxiety disorder) 01/14/2018   Decreased sex drive 69/62/9528   Excessive hair growth 11/10/2016   Irregular menstrual cycle 11/10/2016    PCP: Kandace Organ, NP  REFERRING PROVIDER: Ellene Gustin, MD   REFERRING DIAG: M54.2 (ICD-10-CM) - Cervicalgia   THERAPY DIAG:  Cervicalgia  Muscle weakness (generalized)  Abnormal posture  Rationale for Evaluation and Treatment: Rehabilitation  ONSET DATE: Chronic  SUBJECTIVE:                                                                                                                                                                                                         SUBJECTIVE STATEMENT: Pt presents to PT with reports of chronic hx of neck pain and HA symptoms. Notes that she changed jobs and has to do a lot less lifting OH which seems to improve symptoms.  Has occasional N/T in R hand and has a lot of trouble sleeping comfortably. Pain is greater in R than L with both neck and HA symptoms. Denies b/b changes or saddle anesthesia.   Hand dominance: Left  PERTINENT HISTORY:  Chronic Neck Pain and HA  PAIN:  Are you having pain?  Yes: NPRS scale: 0/10 Worst: 7/10 Pain location: neck, upper trap, levator Pain description: sharp, tight, HA Aggravating factors: lifting, driving Relieving factors: medication  PRECAUTIONS: None  RED FLAGS: None     WEIGHT BEARING RESTRICTIONS: No  FALLS:  Has patient fallen in last 6 months? No  LIVING ENVIRONMENT: Lives with: lives with their family Lives in: House/apartment  OCCUPATION: valet at [employer]  PLOF: Independent  PATIENT GOALS: decrease neck pain and HA symptoms with work and community activites  NEXT MD VISIT: 07/06/2024  OBJECTIVE:  Note: Objective measures were completed at Evaluation unless otherwise noted.  DIAGNOSTIC FINDINGS:  N/A  PATIENT SURVEYS:  NDI: 26/50 - 52% disability  COGNITION: Overall cognitive status: Within functional limits for tasks assessed  SENSATION: WFL  POSTURE: rounded shoulders and forward head  PALPATION: TTP to cervical paraspinals and bilateral upper trap   CERVICAL ROM:   Active ROM A/PROM (deg) eval  Flexion   Extension   Right lateral flexion   Left lateral flexion   Right rotation 70  Left rotation 55   (Blank rows = not tested)  UPPER EXTREMITY MMT:  MMT Right eval Left eval  Shoulder flexion    Shoulder extension    Shoulder abduction    Shoulder adduction    Shoulder extension    Shoulder internal rotation    Shoulder external rotation    Middle trapezius 3/5 3/5  Lower trapezius 3/5 3/5  Grip strength     (Blank rows = not tested)   FUNCTIONAL TESTS:  DNF endurance test: 12 seconds  TREATMENT: [facility] Adult PT Treatment:                                                DATE:  03/24/2024 Therapeutic Exercise: Upper trap stretch x 30" L Chin tuck x 5 - 5" hold Supine pec stretch in 90/90 with towel x 60" Prone I/T x 5 each Standing row x 10 blue band Manual Therapy: Suboccipital release Positional release to bilateral upper trap  PATIENT EDUCATION:  Education details: eval findings, NDI, HEP, POC Person educated: Patient Education method: Explanation, Demonstration, and Handouts Education comprehension: verbalized understanding and returned demonstration  HOME EXERCISE PROGRAM: Access Code: [email or web address] URL: [email or web address] Date: 03/24/2024 Prepared by: Loral Roch  Exercises - Seated Upper Trapezius Stretch  - 1 x daily - 7 x weekly - 3 reps - 30 sec hold - Supine Chin Tuck  - 1 x daily - 7 x weekly - 2 sets - 10 reps - 3 sec hold - Open Book Chest Rotation Stretch on Foam 1/2 Roll  - 1 x daily - 7 x weekly - 2 reps - 60 sec hold - Prone I  - 1 x daily - 7 x weekly - 2 sets - 10 reps - Prone Scapular Retraction Arms at Side  - 1 x daily - 7 x weekly - 2 sets - 10 reps - Standing Shoulder Row with Anchored Resistance  - 1 x daily - 7 x weekly - 3 sets - 10 reps - blue band hold  ASSESSMENT:  CLINICAL IMPRESSION: Patient is a 43 y.o. F who was seen today for physical therapy evaluation and treatment for chronic neck pain and HA. Physical findings are consistent with MD impresson as pt demonstrates decrease in DNF endurance and TTP cervical musculature. NDI score indicates moderate disability in performance of home ADLs and community activities. Pt would benefit from skilled PT services working on improving postural endurance and manual for decreasing neck and HA  symptoms.   OBJECTIVE IMPAIRMENTS: decreased activity tolerance, decreased endurance, decreased mobility, decreased ROM, decreased strength, postural dysfunction, and pain  ACTIVITY LIMITATIONS: carrying, lifting, sitting, standing, and reach over head  PARTICIPATION  LIMITATIONS: driving, shopping, community activity, occupation, and yard work  PERSONAL FACTORS: Time since onset of injury/illness/exacerbation are also affecting patient's functional outcome.   REHAB POTENTIAL: Good  CLINICAL DECISION MAKING: Stable/uncomplicated  EVALUATION COMPLEXITY: Low   GOALS: Goals reviewed with patient? No  SHORT TERM GOALS: Target date: 04/14/2024   Pt will be compliant and knowledgeable with initial HEP for improved comfort and carryover Baseline: initial HEP given  Goal status: INITIAL  2.  Pt will self report neck pain no greater than 5/10 for improved comfort and functional ability Baseline: 7/10 at worst Goal status: INITIAL   LONG TERM GOALS: Target date: 05/19/2024   Pt will decrease NDI disability score to no greater than 30% (15/50) as proxy for functional improvement Baseline: 52% disability (26/50) Goal status: INITIAL   2.  Pt will self report neck pain no greater than 1-2/10 for improved comfort and functional ability Baseline: 7/10 at worst Goal status: INITIAL   3.  Pt will improve DNF endurance to at least 30 seconds for improved strength and postural endurance to decrease neck pain Baseline: 12 sec Goal status: INITIAL  4.  Pt will improve L cervical rotation to at least 70 degrees for improved comfort and function with driving Baseline: 55 degrees Goal status: INITIAL  5.  Pt will improve bilateral middle/lower trap to no less than 4/5 for improved postural endurance and decrease pain and HA symptoms Baseline: 3/5 Goal status: INITIAL   PLAN:  PT FREQUENCY: 1-2x/week  PT DURATION: 8 weeks  PLANNED INTERVENTIONS: 97164- PT Re-evaluation, 97110-Therapeutic exercises, 97530- Therapeutic activity, [id number]- Neuromuscular re-education, 97535- Self Care, 09811- Manual therapy, [id number]- Electrical stimulation (unattended), [id number]- Electrical stimulation (manual), Dry Needling, Cryotherapy, and Moist heat  PLAN FOR NEXT SESSION:  assess HEP response, DNF and periscapular strengthening, manual therapy   Ivor Mars, PT 03/24/2024, 10:25 AM   For all possible CPT codes, reference the Planned Interventions line above.     Check all conditions that are expected to impact treatment: {Conditions expected to impact treatment:Musculoskeletal disorders   If treatment provided at initial evaluation, no treatment charged due to lack of authorization.

## 2024-03-31 ENCOUNTER — Other Ambulatory Visit: Payer: Self-pay | Admitting: Neurology

## 2024-03-31 DIAGNOSIS — G43709 Chronic migraine without aura, not intractable, without status migrainosus: Secondary | ICD-10-CM

## 2024-04-07 ENCOUNTER — Ambulatory Visit: Attending: Neurology

## 2024-04-07 DIAGNOSIS — M542 Cervicalgia: Secondary | ICD-10-CM | POA: Insufficient documentation

## 2024-04-07 DIAGNOSIS — R293 Abnormal posture: Secondary | ICD-10-CM | POA: Diagnosis present

## 2024-04-07 DIAGNOSIS — M6281 Muscle weakness (generalized): Secondary | ICD-10-CM | POA: Insufficient documentation

## 2024-04-07 NOTE — Therapy (Signed)
 OUTPATIENT PHYSICAL THERAPY TREATMENT   Patient Name: Amber Ware MRN: [id number] DOB:02-18-1981, 43 y.o., female Today's Date: 04/07/2024  END OF SESSION:  PT End of Session - 04/07/24 0930     Visit Number 2    Number of Visits 17    Date for PT Re-Evaluation 05/19/24    Authorization Type [employer]    PT Start Time 0930              Past Medical History:  Diagnosis Date   Abnormal Pap smear of cervix 2014   Polycystic ovarian syndrome    Past Surgical History:  Procedure Laterality Date   CERVICAL BIOPSY  W/ LOOP ELECTRODE EXCISION  2014   COLPOSCOPY  2014   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   Liver masses 08/24/2019   Varicose veins of bilateral lower extremities with other complications 01/18/2018   Spider veins of limb 01/18/2018   GAD (generalized anxiety disorder) 01/14/2018   Decreased sex drive 21/30/8657   Excessive hair growth 11/10/2016   Irregular menstrual cycle 11/10/2016    PCP: Kandace Organ, NP  REFERRING PROVIDER: Ellene Gustin, MD   REFERRING DIAG: M54.2 (ICD-10-CM) - Cervicalgia   THERAPY DIAG:  Cervicalgia  Muscle weakness (generalized)  Abnormal posture  Rationale for Evaluation and Treatment: Rehabilitation  ONSET DATE: Chronic  SUBJECTIVE:                                                                                                                                                                                                         SUBJECTIVE STATEMENT: Pt presents to PT with reports of increased pain over last week. Has had pain and HA symptoms.   EVAL: Pt presents to PT with reports of chronic hx of neck pain and HA symptoms. Notes that she changed jobs and has to do a lot less lifting  [place] which seems to improve symptoms. Has occasional N/T in R hand and has a lot of trouble sleeping comfortably. Pain is greater in R than L with both neck and HA symptoms. Denies b/b changes or saddle anesthesia.   Hand dominance: Left  PERTINENT HISTORY:  Chronic Neck Pain and HA  PAIN:  Are you having pain?  Yes: NPRS scale: 0/10 Worst: 7/10 Pain location: neck, upper trap, levator Pain description: sharp, tight, HA Aggravating factors: lifting, driving Relieving factors:  medication  PRECAUTIONS: None  RED FLAGS: None     WEIGHT BEARING RESTRICTIONS: No  FALLS:  Has patient fallen in last 6 months? No  LIVING ENVIRONMENT: Lives with: lives with their family Lives in: House/apartment  OCCUPATION: valet at [employer]  PLOF: Independent  PATIENT GOALS: decrease neck pain and HA symptoms with work and community activites  NEXT MD VISIT: 07/06/2024  OBJECTIVE:  Note: Objective measures were completed at Evaluation unless otherwise noted.  DIAGNOSTIC FINDINGS:  N/A  PATIENT SURVEYS:  NDI: 26/50 - 52% disability  COGNITION: Overall cognitive status: Within functional limits for tasks assessed  SENSATION: WFL  POSTURE: rounded shoulders and forward head  PALPATION: TTP to cervical paraspinals and bilateral upper trap   CERVICAL ROM:   Active ROM A/PROM (deg) eval  Flexion   Extension   Right lateral flexion   Left lateral flexion   Right rotation 70  Left rotation 55   (Blank rows = not tested)  UPPER EXTREMITY MMT:  MMT Right eval Left eval  Shoulder flexion    Shoulder extension    Shoulder abduction    Shoulder adduction    Shoulder extension    Shoulder internal rotation    Shoulder external rotation    Middle trapezius 3/5 3/5  Lower trapezius 3/5 3/5  Grip strength     (Blank rows = not tested)   FUNCTIONAL TESTS:  DNF endurance test: 12 seconds  TREATMENT: [facility] Adult PT Treatment:                                                 DATE: 04/07/2024 UBE lvl 1.0 x 3 min for functional activity tolerance Chin tuck 2x10 - 5" hold Supine horizontal abd 2x15 RTB Seated bilateral ER 2x15 RTB Standing row 2x15 blue band Manual Therapy: Suboccipital release Positional release to bilateral upper trap STM and TPR to R upper trap and R levator  [facility] Adult PT Treatment:                                                DATE: 03/24/2024 Therapeutic Exercise: Upper trap stretch x 30" L Chin tuck x 5 - 5" hold Supine pec stretch in 90/90 with towel x 60" Prone I/T x 5 each Standing row x 10 blue band Manual Therapy: Suboccipital release Positional release to bilateral upper trap  PATIENT EDUCATION:  Education details: eval findings, NDI, HEP, POC Person educated: Patient Education method: Explanation, Demonstration, and Handouts Education comprehension: verbalized understanding and returned demonstration  HOME EXERCISE PROGRAM: Access Code: [email or web address] URL: [email or web address] Date: 04/07/2024 Prepared by: Loral Roch  Exercises - Seated Upper Trapezius Stretch  - 1 x daily - 7 x weekly - 3 reps - 30 sec hold - Supine Chin Tuck  - 1 x daily - 7 x weekly - 2 sets - 10 reps - 3 sec hold - Open Book Chest Rotation Stretch on Foam 1/2 Roll  - 1 x daily - 7 x weekly - 2 reps - 60 sec hold - Prone I  - 1 x daily - 7 x weekly - 2 sets - 10 reps - Prone Scapular Retraction Arms at Side  - 1 x daily - 7 x  weekly - 2 sets - 10 reps - Standing Shoulder Row with Anchored Resistance  - 1 x daily - 7 x weekly - 3 sets - 10 reps - blue band hold - Supine Shoulder Horizontal Abduction with Resistance  - 1 x daily - 7 x weekly - 2 sets - 15 reps - red band hold  ASSESSMENT:  CLINICAL IMPRESSION: Pt was able to complete all prescribed exercises with no adverse effect. Exercises today focused on improving DNF and periscapular strength in order to decrease pain and improve posture. She responded well to manual  therapy interventions noting 0/10 pain post session. HEP was updated for continued periscapular strengthening. PT will continue to progress as able per POC.   EVAL: Patient is a 43 y.o. F who was seen today for physical therapy evaluation and treatment for chronic neck pain and HA. Physical findings are consistent with MD impresson as pt demonstrates decrease in DNF endurance and TTP cervical musculature. NDI score indicates moderate disability in performance of home ADLs and community activities. Pt would benefit from skilled PT services working on improving postural endurance and manual for decreasing neck and HA symptoms.   OBJECTIVE IMPAIRMENTS: decreased activity tolerance, decreased endurance, decreased mobility, decreased ROM, decreased strength, postural dysfunction, and pain  ACTIVITY LIMITATIONS: carrying, lifting, sitting, standing, and reach over head  PARTICIPATION LIMITATIONS: driving, shopping, community activity, occupation, and yard work  PERSONAL FACTORS: Time since onset of injury/illness/exacerbation are also affecting patient's functional outcome.   REHAB POTENTIAL: Good  CLINICAL DECISION MAKING: Stable/uncomplicated  EVALUATION COMPLEXITY: Low   GOALS: Goals reviewed with patient? No  SHORT TERM GOALS: Target date: 04/14/2024   Pt will be compliant and knowledgeable with initial HEP for improved comfort and carryover Baseline: initial HEP given  Goal status: INITIAL  2.  Pt will self report neck pain no greater than 5/10 for improved comfort and functional ability Baseline: 7/10 at worst Goal status: INITIAL   LONG TERM GOALS: Target date: 05/19/2024   Pt will decrease NDI disability score to no greater than 30% (15/50) as proxy for functional improvement Baseline: 52% disability (26/50) Goal status: INITIAL   2.  Pt will self report neck pain no greater than 1-2/10 for improved comfort and functional ability Baseline: 7/10 at worst Goal status: INITIAL    3.  Pt will improve DNF endurance to at least 30 seconds for improved strength and postural endurance to decrease neck pain Baseline: 12 sec Goal status: INITIAL  4.  Pt will improve L cervical rotation to at least 70 degrees for improved comfort and function with driving Baseline: 55 degrees Goal status: INITIAL  5.  Pt will improve bilateral middle/lower trap to no less than 4/5 for improved postural endurance and decrease pain and HA symptoms Baseline: 3/5 Goal status: INITIAL   PLAN:  PT FREQUENCY: 1-2x/week  PT DURATION: 8 weeks  PLANNED INTERVENTIONS: 97164- PT Re-evaluation, 97110-Therapeutic exercises, 97530- Therapeutic activity, [id number]- Neuromuscular re-education, 97535- Self Care, [id number]- Manual therapy, [id number]- Electrical stimulation (unattended), [id number]- Electrical stimulation (manual), Dry Needling, Cryotherapy, and Moist heat  PLAN FOR NEXT SESSION: assess HEP response, DNF and periscapular strengthening, manual therapy   Ivor Mars, PT 04/07/2024, 9:30 AM   For all possible CPT codes, reference the Planned Interventions line above.     Check all conditions that are expected to impact treatment: {Conditions expected to impact treatment:Musculoskeletal disorders   If treatment provided at initial evaluation, no treatment charged due to lack of authorization.

## 2024-04-14 ENCOUNTER — Ambulatory Visit

## 2024-04-14 DIAGNOSIS — M542 Cervicalgia: Secondary | ICD-10-CM

## 2024-04-14 DIAGNOSIS — R293 Abnormal posture: Secondary | ICD-10-CM

## 2024-04-14 DIAGNOSIS — M6281 Muscle weakness (generalized): Secondary | ICD-10-CM

## 2024-04-14 NOTE — Therapy (Signed)
 OUTPATIENT PHYSICAL THERAPY TREATMENT   Patient Name: Amber Ware MRN: [id number] DOB:Sep 09, 1981, 43 y.o., female Today's Date: 04/14/2024  END OF SESSION:  PT End of Session - 04/14/24 0800     Visit Number 3    Number of Visits 17    Date for PT Re-Evaluation 05/19/24    Authorization Type [employer]    PT Start Time 0800    PT Stop Time 0840    PT Time Calculation (min) 40 min            Past Medical History:  Diagnosis Date   Abnormal Pap smear of cervix 2014   Polycystic ovarian syndrome    Past Surgical History:  Procedure Laterality Date   CERVICAL BIOPSY  W/ LOOP ELECTRODE EXCISION  2014   COLPOSCOPY  2014   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   Liver masses 08/24/2019   Varicose veins of bilateral lower extremities with other complications 01/18/2018   Spider veins of limb 01/18/2018   GAD (generalized anxiety disorder) 01/14/2018   Decreased sex drive 95/62/1308   Excessive hair growth 11/10/2016   Irregular menstrual cycle 11/10/2016    PCP: Kandace Organ, NP  REFERRING PROVIDER: Ellene Gustin, MD   REFERRING DIAG: M54.2 (ICD-10-CM) - Cervicalgia   THERAPY DIAG:  Cervicalgia  Muscle weakness (generalized)  Abnormal posture  Rationale for Evaluation and Treatment: Rehabilitation  ONSET DATE: Chronic  SUBJECTIVE:                                                                                                                                                                                                         SUBJECTIVE STATEMENT: Pt presents to PT with reports of R shoulder pain and burning. Denies neck pain or HA symptoms.   EVAL: Pt presents to PT with reports of chronic hx of neck pain and HA symptoms.  Notes that she changed jobs and has to do a lot less lifting [place] which seems to improve symptoms. Has occasional N/T in R hand and has a lot of trouble sleeping comfortably. Pain is greater in R than L with both neck and HA symptoms. Denies b/b changes or saddle anesthesia.   Hand dominance: Left  PERTINENT HISTORY:  Chronic Neck Pain and HA  PAIN:  Are you having pain?  Yes: NPRS scale: 0/10 Worst: 7/10 Pain location: neck,  upper trap, levator Pain description: sharp, tight, HA Aggravating factors: lifting, driving Relieving factors: medication  PRECAUTIONS: None  RED FLAGS: None     WEIGHT BEARING RESTRICTIONS: No  FALLS:  Has patient fallen in last 6 months? 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  DIAGNOSTIC FINDINGS:  N/A  PATIENT SURVEYS:  NDI: 26/50 - 52% disability  COGNITION: Overall cognitive status: Within functional limits for tasks assessed  SENSATION: WFL  POSTURE: rounded shoulders and forward head  PALPATION: TTP to cervical paraspinals and bilateral upper trap   CERVICAL ROM:   Active ROM A/PROM (deg) eval  Flexion   Extension   Right lateral flexion   Left lateral flexion   Right rotation 70  Left rotation 55   (Blank rows = not tested)  UPPER EXTREMITY MMT:  MMT Right eval Left eval  Shoulder flexion    Shoulder extension    Shoulder abduction    Shoulder adduction    Shoulder extension    Shoulder internal rotation    Shoulder external rotation    Middle trapezius 3/5 3/5  Lower trapezius 3/5 3/5  Grip strength     (Blank rows = not tested)   FUNCTIONAL TESTS:  DNF endurance test: 12  seconds  TREATMENT: [facility] Adult PT Treatment:                                                DATE: 04/14/2024 UBE lvl 1.5 x 3 min for functional activity tolerance Chin tuck x 10 - 5 hold Supine horizontal abd 2x15 RTB Supine shoulder flexion 2x10 2# R Prone Y/T/W x 10 each Prone I 2x10 2# Seated bilateral ER 2x15 RTB FM row 3x10 17# Manual Therapy: Suboccipital release Positional release to bilateral upper trap  [facility] Adult PT Treatment:                                                DATE: 04/07/2024 UBE lvl 1.0 x 3 min for functional activity tolerance Chin tuck 2x10 - 5 hold Supine horizontal abd 2x15 RTB Seated bilateral ER 2x15 RTB Standing row 2x15 blue band Manual Therapy: Suboccipital release Positional release to bilateral upper trap STM and TPR to R upper trap and R levator  [facility] Adult PT Treatment:                                                DATE: 03/24/2024 Therapeutic Exercise: Upper trap stretch x 30 L Chin tuck x 5 - 5 hold Supine pec stretch in 90/90 with towel x 60 Prone I/T x 5 each Standing row x 10 blue band Manual Therapy: Suboccipital release Positional release to bilateral upper trap  PATIENT EDUCATION:  Education details: eval findings, NDI, HEP, POC Person educated: Patient Education method: Explanation, Demonstration, and Handouts Education comprehension: verbalized  understanding and returned demonstration  HOME EXERCISE PROGRAM: Access Code: [email or web address] URL: [email or web address] Date: 04/07/2024 Prepared by: Loral Roch  Exercises - Seated Upper Trapezius Stretch  - 1 x daily - 7 x weekly - 3 reps - 30 sec hold - Supine Chin Tuck  - 1 x daily - 7 x weekly - 2 sets - 10 reps - 3 sec hold - Open Book Chest Rotation Stretch on Foam 1/2 Roll  - 1 x daily - 7 x weekly - 2 reps - 60 sec hold - Prone I  - 1 x daily - 7 x weekly - 2 sets - 10 reps - Prone Scapular Retraction Arms at Side  - 1 x daily - 7 x weekly - 2 sets - 10  reps - Standing Shoulder Row with Anchored Resistance  - 1 x daily - 7 x weekly - 3 sets - 10 reps - blue band hold - Supine Shoulder Horizontal Abduction with Resistance  - 1 x daily - 7 x weekly - 2 sets - 15 reps - red band hold  ASSESSMENT:  CLINICAL IMPRESSION: Pt was able to complete all prescribed exercises with no adverse effect. Exercises today focused on improving DNF and periscapular strength in order to decrease pain and improve postural endurance. She responded well to manual therapy interventions today once again noting decreased R shoulder discomfort post session. No changes to HEP today. PT will continue to progress as able per POC.   EVAL: Patient is a 43 y.o. F who was seen today for physical therapy evaluation and treatment for chronic neck pain and HA. Physical findings are consistent with MD impresson as pt demonstrates decrease in DNF endurance and TTP cervical musculature. NDI score indicates moderate disability in performance of home ADLs and community activities. Pt would benefit from skilled PT services working on improving postural endurance and manual for decreasing neck and HA symptoms.   OBJECTIVE IMPAIRMENTS: decreased activity tolerance, decreased endurance, decreased mobility, decreased ROM, decreased strength, postural dysfunction, and pain  ACTIVITY LIMITATIONS: carrying, lifting, sitting, standing, and reach over head  PARTICIPATION LIMITATIONS: driving, shopping, community activity, occupation, and yard work  PERSONAL FACTORS: Time since onset of injury/illness/exacerbation are also affecting patient's functional outcome.   REHAB POTENTIAL: Good  CLINICAL DECISION MAKING: Stable/uncomplicated  EVALUATION COMPLEXITY: Low   GOALS: Goals reviewed with patient? No  SHORT TERM GOALS: Target date: 04/14/2024   Pt will be compliant and knowledgeable with initial HEP for improved comfort and carryover Baseline: initial HEP given  Goal status: INITIAL  2.   Pt will self report neck pain no greater than 5/10 for improved comfort and functional ability Baseline: 7/10 at worst Goal status: INITIAL   LONG TERM GOALS: Target date: 05/19/2024   Pt will decrease NDI disability score to no greater than 30% (15/50) as proxy for functional improvement Baseline: 52% disability (26/50) Goal status: INITIAL   2.  Pt will self report neck pain no greater than 1-2/10 for improved comfort and functional ability Baseline: 7/10 at worst Goal status: INITIAL   3.  Pt will improve DNF endurance to at least 30 seconds for improved strength and postural endurance to decrease neck pain Baseline: 12 sec Goal status: INITIAL  4.  Pt will improve L cervical rotation to at least 70 degrees for improved comfort and function with driving Baseline: 55 degrees Goal status: INITIAL  5.  Pt will improve bilateral middle/lower trap to no less than 4/5 for improved postural endurance and decrease pain  and HA symptoms Baseline: 3/5 Goal status: INITIAL   PLAN:  PT FREQUENCY: 1-2x/week  PT DURATION: 8 weeks  PLANNED INTERVENTIONS: 97164- PT Re-evaluation, 97110-Therapeutic exercises, 97530- Therapeutic activity, [id number]- Neuromuscular re-education, 97535- Self Care, 16109- Manual therapy, G0283- Electrical stimulation (unattended), [id number]- Electrical stimulation (manual), Dry Needling, Cryotherapy, and Moist heat  PLAN FOR NEXT SESSION: assess HEP response, DNF and periscapular strengthening, manual therapy   Ivor Mars, PT 04/14/2024, 8:45 AM   For all possible CPT codes, reference the Planned Interventions line above.     Check all conditions that are expected to impact treatment: {Conditions expected to impact treatment:Musculoskeletal disorders   If treatment provided at initial evaluation, no treatment charged due to lack of authorization.

## 2024-04-21 ENCOUNTER — Ambulatory Visit

## 2024-04-21 DIAGNOSIS — R293 Abnormal posture: Secondary | ICD-10-CM

## 2024-04-21 DIAGNOSIS — M6281 Muscle weakness (generalized): Secondary | ICD-10-CM

## 2024-04-21 DIAGNOSIS — M542 Cervicalgia: Secondary | ICD-10-CM

## 2024-04-21 NOTE — Therapy (Signed)
 OUTPATIENT PHYSICAL THERAPY TREATMENT   Patient Name: Amber Ware MRN: [id number] DOB:December 02, 1980, 43 y.o., female Today's Date: 04/21/2024  END OF SESSION:  PT End of Session - 04/21/24 0754     Visit Number 4    Number of Visits 17    Date for PT Re-Evaluation 05/19/24    Authorization Type [employer]    PT Start Time 0800    PT Stop Time 0840    PT Time Calculation (min) 40 min             Past Medical History:  Diagnosis Date   Abnormal Pap smear of cervix 2014   Polycystic ovarian syndrome    Past Surgical History:  Procedure Laterality Date   CERVICAL BIOPSY  W/ LOOP ELECTRODE EXCISION  2014   COLPOSCOPY  2014   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   Liver masses 08/24/2019   Varicose veins of bilateral lower extremities with other complications 01/18/2018   Spider veins of limb 01/18/2018   GAD (generalized anxiety disorder) 01/14/2018   Decreased sex drive 40/98/1191   Excessive hair growth 11/10/2016   Irregular menstrual cycle 11/10/2016    PCP: Kandace Organ, NP  REFERRING PROVIDER: Ellene Gustin, MD   REFERRING DIAG: M54.2 (ICD-10-CM) - Cervicalgia   THERAPY DIAG:  Cervicalgia  Muscle weakness (generalized)  Abnormal posture  Rationale for Evaluation and Treatment: Rehabilitation  ONSET DATE: Chronic  SUBJECTIVE:                                                                                                                                                                                                         SUBJECTIVE STATEMENT: Pt presents to PT with reports that things are continuing to improve. Has continued HEP compliance with no adverse effect.   EVAL: Pt presents to PT with reports of chronic hx of  neck pain and HA symptoms. Notes that she changed jobs and has to do a lot less lifting [place] which seems to improve symptoms. Has occasional N/T in R hand and has a lot of trouble sleeping comfortably. Pain is greater in R than L with both neck and HA symptoms. Denies b/b changes or saddle anesthesia.   Hand dominance: Left  PERTINENT HISTORY:  Chronic Neck Pain and HA  PAIN:  Are you having pain?  Yes: NPRS scale: 0/10 Worst: 7/10  Pain location: neck, upper trap, levator Pain description: sharp, tight, HA Aggravating factors: lifting, driving Relieving factors: medication  PRECAUTIONS: None  RED FLAGS: None     WEIGHT BEARING RESTRICTIONS: No  FALLS:  Has patient fallen in last 6 months? No  LIVING ENVIRONMENT: Lives with: lives with their family Lives in: House/apartment  OCCUPATION: valet at [employer]  PLOF: Independent  PATIENT GOALS: decrease neck pain and HA symptoms with work and community activites  NEXT MD VISIT: 07/06/2024  OBJECTIVE:  Note: Objective measures were completed at Evaluation unless otherwise noted.  DIAGNOSTIC FINDINGS:  N/A  PATIENT SURVEYS:  NDI: 26/50 - 52% disability  COGNITION: Overall cognitive status: Within functional limits for tasks assessed  SENSATION: WFL  POSTURE: rounded shoulders and forward head  PALPATION: TTP to cervical paraspinals and bilateral upper trap   CERVICAL ROM:   Active ROM A/PROM (deg) eval  Flexion   Extension   Right lateral flexion   Left lateral flexion   Right rotation 70  Left rotation 55   (Blank rows = not tested)  UPPER EXTREMITY MMT:  MMT Right eval Left eval  Shoulder flexion    Shoulder extension    Shoulder abduction    Shoulder adduction    Shoulder extension    Shoulder internal rotation    Shoulder external rotation    Middle trapezius 3/5 3/5  Lower trapezius 3/5 3/5  Grip strength     (Blank rows = not tested)   FUNCTIONAL TESTS:  DNF endurance  test: 12 seconds  TREATMENT: [facility] Adult PT Treatment:                                                DATE: 04/21/2024 UBE lvl 1.5 x 3 min for functional activity tolerance Chin tuck x 10 - 5 hold Supine horizontal abd 2x15 GTB POE chin tuck x 10 Prone Y/T/I x 10 each 1# FM row 3x10 17# Seated low row 2x10 25# Seated lat pulldown 2x10 25# Standing chin tuck with ball x 10 - 5 hold Manual Therapy: Suboccipital release Positional release to bilateral upper trap  [facility] Adult PT Treatment:                                                DATE: 04/14/2024 UBE lvl 1.5 x 3 min for functional activity tolerance Chin tuck x 10 - 5 hold Supine horizontal abd 2x15 RTB Supine shoulder flexion 2x10 2# R Prone Y/T/W x 10 each Prone I 2x10 2# Seated bilateral ER 2x15 RTB FM row 3x10 17# Manual Therapy: Suboccipital release Positional release to bilateral upper trap  [facility] Adult PT Treatment:                                                DATE: 04/07/2024 UBE lvl 1.0 x 3 min for functional activity tolerance Chin tuck 2x10 - 5 hold Supine horizontal abd 2x15 RTB Seated bilateral ER 2x15 RTB Standing row 2x15 blue band Manual Therapy: Suboccipital release Positional release to bilateral upper trap STM and TPR to R upper trap and  R levator  [facility] Adult PT Treatment:                                                DATE: 03/24/2024 Therapeutic Exercise: Upper trap stretch x 30 L Chin tuck x 5 - 5 hold Supine pec stretch in 90/90 with towel x 60 Prone I/T x 5 each Standing row x 10 blue band Manual Therapy: Suboccipital release Positional release to bilateral upper trap  PATIENT EDUCATION:  Education details: eval findings, NDI, HEP, POC Person educated: Patient Education method: Explanation, Demonstration, and Handouts Education comprehension: verbalized understanding and returned demonstration  HOME EXERCISE PROGRAM: Access Code: [email or web address] URL:  [email or web address] Date: 04/07/2024 Prepared by: Loral Roch  Exercises - Seated Upper Trapezius Stretch  - 1 x daily - 7 x weekly - 3 reps - 30 sec hold - Supine Chin Tuck  - 1 x daily - 7 x weekly - 2 sets - 10 reps - 3 sec hold - Open Book Chest Rotation Stretch on Foam 1/2 Roll  - 1 x daily - 7 x weekly - 2 reps - 60 sec hold - Prone I  - 1 x daily - 7 x weekly - 2 sets - 10 reps - Prone Scapular Retraction Arms at Side  - 1 x daily - 7 x weekly - 2 sets - 10 reps - Standing Shoulder Row with Anchored Resistance  - 1 x daily - 7 x weekly - 3 sets - 10 reps - blue band hold - Supine Shoulder Horizontal Abduction with Resistance  - 1 x daily - 7 x weekly - 2 sets - 15 reps - red band hold  ASSESSMENT:  CLINICAL IMPRESSION: Pt was able to complete all prescribed exercises with no adverse effect and tolerated progression well. She has made good progression with therapy noting continued decreases in pain. We continued to focus on DNF and periscapular strength in order to decrease pain and improve comfort. Pt progressing well, will continue to be seen per POC.   EVAL: Patient is a 43 y.o. F who was seen today for physical therapy evaluation and treatment for chronic neck pain and HA. Physical findings are consistent with MD impresson as pt demonstrates decrease in DNF endurance and TTP cervical musculature. NDI score indicates moderate disability in performance of home ADLs and community activities. Pt would benefit from skilled PT services working on improving postural endurance and manual for decreasing neck and HA symptoms.   OBJECTIVE IMPAIRMENTS: decreased activity tolerance, decreased endurance, decreased mobility, decreased ROM, decreased strength, postural dysfunction, and pain  ACTIVITY LIMITATIONS: carrying, lifting, sitting, standing, and reach over head  PARTICIPATION LIMITATIONS: driving, shopping, community activity, occupation, and yard work  PERSONAL FACTORS:  Time since onset of injury/illness/exacerbation are also affecting patient's functional outcome.   REHAB POTENTIAL: Good  CLINICAL DECISION MAKING: Stable/uncomplicated  EVALUATION COMPLEXITY: Low   GOALS: Goals reviewed with patient? No  SHORT TERM GOALS: Target date: 04/14/2024   Pt will be compliant and knowledgeable with initial HEP for improved comfort and carryover Baseline: initial HEP given  Goal status: MET  2.  Pt will self report neck pain no greater than 5/10 for improved comfort and functional ability Baseline: 7/10 at worst Goal status: MET   LONG TERM GOALS: Target date: 05/19/2024   Pt will decrease NDI disability score to no greater than 30% (  15/50) as proxy for functional improvement Baseline: 52% disability (26/50) Goal status: INITIAL   2.  Pt will self report neck pain no greater than 1-2/10 for improved comfort and functional ability Baseline: 7/10 at worst Goal status: INITIAL   3.  Pt will improve DNF endurance to at least 30 seconds for improved strength and postural endurance to decrease neck pain Baseline: 12 sec Goal status: INITIAL  4.  Pt will improve L cervical rotation to at least 70 degrees for improved comfort and function with driving Baseline: 55 degrees Goal status: INITIAL  5.  Pt will improve bilateral middle/lower trap to no less than 4/5 for improved postural endurance and decrease pain and HA symptoms Baseline: 3/5 Goal status: INITIAL   PLAN:  PT FREQUENCY: 1-2x/week  PT DURATION: 8 weeks  PLANNED INTERVENTIONS: 97164- PT Re-evaluation, 97110-Therapeutic exercises, 97530- Therapeutic activity, [id number]- Neuromuscular re-education, 97535- Self Care, 16010- Manual therapy, G0283- Electrical stimulation (unattended), [id number]- Electrical stimulation (manual), Dry Needling, Cryotherapy, and Moist heat  PLAN FOR NEXT SESSION: assess HEP response, DNF and periscapular strengthening, manual therapy   Ivor Mars, PT 04/21/2024,  8:57 AM   For all possible CPT codes, reference the Planned Interventions line above.     Check all conditions that are expected to impact treatment: {Conditions expected to impact treatment:Musculoskeletal disorders   If treatment provided at initial evaluation, no treatment charged due to lack of authorization.

## 2024-04-25 ENCOUNTER — Ambulatory Visit: Payer: Self-pay

## 2024-04-25 DIAGNOSIS — M542 Cervicalgia: Secondary | ICD-10-CM | POA: Diagnosis not present

## 2024-04-25 DIAGNOSIS — R293 Abnormal posture: Secondary | ICD-10-CM

## 2024-04-25 DIAGNOSIS — M6281 Muscle weakness (generalized): Secondary | ICD-10-CM

## 2024-04-25 NOTE — Therapy (Signed)
 OUTPATIENT PHYSICAL THERAPY TREATMENT   Patient Name: Amber Ware MRN: [id number] DOB:11-26-1980, 43 y.o., female Today's Date: 04/25/2024  END OF SESSION:  PT End of Session - 04/25/24 0759     Visit Number 5    Number of Visits 17    Date for PT Re-Evaluation 05/19/24    Authorization Type [employer]    Authorization Time Period approved 6 PT visits from 04/21/24-06/19/24    Authorization - Visit Number 2    Authorization - Number of Visits 6    PT Start Time 0800    PT Stop Time 0840    PT Time Calculation (min) 40 min              Past Medical History:  Diagnosis Date   Abnormal Pap smear of cervix 2014   Polycystic ovarian syndrome    Past Surgical History:  Procedure Laterality Date   CERVICAL BIOPSY  W/ LOOP ELECTRODE EXCISION  2014   COLPOSCOPY  2014   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   Liver masses 08/24/2019   Varicose veins of bilateral lower extremities with other complications 01/18/2018   Spider veins of limb 01/18/2018   GAD (generalized anxiety disorder) 01/14/2018   Decreased sex drive 88/72/7981   Excessive hair growth 11/10/2016   Irregular menstrual cycle 11/10/2016    PCP: Katheen Roselie Rockford, NP  REFERRING PROVIDER: Leigh Venetia CROME, MD   REFERRING DIAG: M54.2 (ICD-10-CM) - Cervicalgia   THERAPY DIAG:  Cervicalgia  Muscle weakness (generalized)  Abnormal posture  Rationale for Evaluation and Treatment: Rehabilitation  ONSET DATE: Chronic  SUBJECTIVE:                                                                                                                                                                                                         SUBJECTIVE STATEMENT: Pt presents to PT with reports of  continued improvement in neck pain. Did not have HA symptoms over the weekend. Has been compliant with HEP.   EVAL: Pt presents to PT with reports of chronic hx of neck pain and HA symptoms. Notes that she changed jobs and has to do a lot less lifting [place] which seems to improve symptoms. Has occasional N/T in R hand and has a lot of trouble sleeping comfortably. Pain is greater in R than L with both neck and HA  symptoms. Denies b/b changes or saddle anesthesia.   Hand dominance: Left  PERTINENT HISTORY:  Chronic Neck Pain and HA  PAIN:  Are you having pain?  Yes: NPRS scale: 0/10 Worst: 7/10 Pain location: neck, upper trap, levator Pain description: sharp, tight, HA Aggravating factors: lifting, driving Relieving factors: medication  PRECAUTIONS: None  RED FLAGS: None     WEIGHT BEARING RESTRICTIONS: No  FALLS:  Has patient fallen in last 6 months? 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  DIAGNOSTIC FINDINGS:  N/A  PATIENT SURVEYS:  NDI: 26/50 - 52% disability  COGNITION: Overall cognitive status: Within functional limits for tasks assessed  SENSATION: WFL  POSTURE: rounded shoulders and forward head  PALPATION: TTP to cervical paraspinals and bilateral upper trap   CERVICAL ROM:   Active ROM A/PROM (deg) eval  Flexion   Extension   Right lateral flexion   Left lateral flexion   Right rotation 70  Left rotation 55   (Blank rows = not tested)  UPPER EXTREMITY MMT:  MMT Right eval Left eval  Shoulder flexion    Shoulder extension    Shoulder abduction    Shoulder adduction    Shoulder extension    Shoulder internal rotation     Shoulder external rotation    Middle trapezius 3/5 3/5  Lower trapezius 3/5 3/5  Grip strength     (Blank rows = not tested)   FUNCTIONAL TESTS:  DNF endurance test: 12 seconds  TREATMENT: [facility] Adult PT Treatment:                                                DATE: 04/25/2024 UBE lvl 1.5 x 3 min for functional activity tolerance Chin tuck x 10 - 5 hold POE chin tuck x 10 Prone Y/T/I x 10 each 2# Prone W with ext x 10  FM row 3x12 17# Seated low row 2x10 25# Seated lat pulldown 2x10 25# Serratus wall slide with foam 2x10 YTB Standing chin tuck with ball x 10 - 5 hold Manual Therapy: Suboccipital release Positional release to bilateral upper trap  [facility] Adult PT Treatment:                                                DATE: 04/21/2024 UBE lvl 1.5 x 3 min for functional activity tolerance Chin tuck x 10 - 5 hold Supine horizontal abd 2x15 GTB POE chin tuck x 10 Prone Y/T/I x 10 each 1# FM row 3x10 17# Seated low row 2x10 25# Seated lat pulldown 2x10 25# Standing chin tuck with ball x 10 - 5 hold Manual Therapy: Suboccipital release Positional release to bilateral upper trap  [facility] [place] Adult PT Treatment:  DATE: 04/14/2024 UBE lvl 1.5 x 3 min for functional activity tolerance Chin tuck x 10 - 5 hold Supine horizontal abd 2x15 RTB Supine shoulder flexion 2x10 2# R Prone Y/T/W x 10 each Prone I 2x10 2# Seated bilateral ER 2x15 RTB FM row 3x10 17# Manual Therapy: Suboccipital release Positional release to bilateral upper trap  [facility] Adult PT Treatment:                                                DATE: 04/07/2024 UBE lvl 1.0 x 3 min for functional activity tolerance Chin tuck 2x10 - 5 hold Supine horizontal abd 2x15 RTB Seated bilateral ER 2x15 RTB Standing row 2x15 blue band Manual Therapy: Suboccipital release Positional release to bilateral upper trap STM and TPR to R upper trap and R levator  [facility] Adult PT  Treatment:                                                DATE: 03/24/2024 Therapeutic Exercise: Upper trap stretch x 30 L Chin tuck x 5 - 5 hold Supine pec stretch in 90/90 with towel x 60 Prone I/T x 5 each Standing row x 10 blue band Manual Therapy: Suboccipital release Positional release to bilateral upper trap  PATIENT EDUCATION:  Education details: eval findings, NDI, HEP, POC Person educated: Patient Education method: Explanation, Demonstration, and Handouts Education comprehension: verbalized understanding and returned demonstration  HOME EXERCISE PROGRAM: Access Code: [email or web address] URL: [email or web address] Date: 04/07/2024 Prepared by: Alm Kingdom  Exercises - Seated Upper Trapezius Stretch  - 1 x daily - 7 x weekly - 3 reps - 30 sec hold - Supine Chin Tuck  - 1 x daily - 7 x weekly - 2 sets - 10 reps - 3 sec hold - Open Book Chest Rotation Stretch on Foam 1/2 Roll  - 1 x daily - 7 x weekly - 2 reps - 60 sec hold - Prone I  - 1 x daily - 7 x weekly - 2 sets - 10 reps - Prone Scapular Retraction Arms at Side  - 1 x daily - 7 x weekly - 2 sets - 10 reps - Standing Shoulder Row with Anchored Resistance  - 1 x daily - 7 x weekly - 3 sets - 10 reps - blue band hold - Supine Shoulder Horizontal Abduction with Resistance  - 1 x daily - 7 x weekly - 2 sets - 15 reps - red band hold  ASSESSMENT:  CLINICAL IMPRESSION: Pt was able to complete all prescribed exercises with no adverse effect and tolerated progression well. Today therapy continued to focus on DNF and periscapular strength in order to decrease pain and improve comfort. Manual therapy with decrease in pressure and tightness noted. Pt progressing well, will continue to be seen per POC.   EVAL: Patient is a 43 y.o. F who was seen today for physical therapy evaluation and treatment for chronic neck pain and HA. Physical findings are consistent with MD impresson as pt demonstrates decrease in DNF endurance and  TTP cervical musculature. NDI score indicates moderate disability in performance of home ADLs and community activities. Pt would benefit from skilled PT services working on improving postural endurance and manual for decreasing  neck and HA symptoms.   OBJECTIVE IMPAIRMENTS: decreased activity tolerance, decreased endurance, decreased mobility, decreased ROM, decreased strength, postural dysfunction, and pain  ACTIVITY LIMITATIONS: carrying, lifting, sitting, standing, and reach over head  PARTICIPATION LIMITATIONS: driving, shopping, community activity, occupation, and yard work  PERSONAL FACTORS: Time since onset of injury/illness/exacerbation are also affecting patient's functional outcome.   REHAB POTENTIAL: Good  CLINICAL DECISION MAKING: Stable/uncomplicated  EVALUATION COMPLEXITY: Low   GOALS: Goals reviewed with patient? No  SHORT TERM GOALS: Target date: 04/14/2024   Pt will be compliant and knowledgeable with initial HEP for improved comfort and carryover Baseline: initial HEP given  Goal status: MET  2.  Pt will self report neck pain no greater than 5/10 for improved comfort and functional ability Baseline: 7/10 at worst Goal status: MET   LONG TERM GOALS: Target date: 05/19/2024   Pt will decrease NDI disability score to no greater than 30% (15/50) as proxy for functional improvement Baseline: 52% disability (26/50) Goal status: INITIAL   2.  Pt will self report neck pain no greater than 1-2/10 for improved comfort and functional ability Baseline: 7/10 at worst Goal status: INITIAL   3.  Pt will improve DNF endurance to at least 30 seconds for improved strength and postural endurance to decrease neck pain Baseline: 12 sec Goal status: INITIAL  4.  Pt will improve L cervical rotation to at least 70 degrees for improved comfort and function with driving Baseline: 55 degrees Goal status: INITIAL  5.  Pt will improve bilateral middle/lower trap to no less than 4/5  for improved postural endurance and decrease pain and HA symptoms Baseline: 3/5 Goal status: INITIAL   PLAN:  PT FREQUENCY: 1-2x/week  PT DURATION: 8 weeks  PLANNED INTERVENTIONS: 97164- PT Re-evaluation, 97110-Therapeutic exercises, 97530- Therapeutic activity, [id number]- Neuromuscular re-education, 97535- Self Care, [id number]- Manual therapy, G0283- Electrical stimulation (unattended), [id number]- Electrical stimulation (manual), Dry Needling, Cryotherapy, and Moist heat  PLAN FOR NEXT SESSION: assess HEP response, DNF and periscapular strengthening, manual therapy   Alm JAYSON Kingdom, PT 04/25/2024, 9:16 AM   For all possible CPT codes, reference the Planned Interventions line above.     Check all conditions that are expected to impact treatment: {Conditions expected to impact treatment:Musculoskeletal disorders   If treatment provided at initial evaluation, no treatment charged due to lack of authorization.

## 2024-05-08 ENCOUNTER — Ambulatory Visit: Attending: Neurology

## 2024-05-08 DIAGNOSIS — M6281 Muscle weakness (generalized): Secondary | ICD-10-CM | POA: Diagnosis not present

## 2024-05-08 DIAGNOSIS — M542 Cervicalgia: Secondary | ICD-10-CM | POA: Diagnosis not present

## 2024-05-08 DIAGNOSIS — R293 Abnormal posture: Secondary | ICD-10-CM | POA: Insufficient documentation

## 2024-05-08 NOTE — Therapy (Signed)
 OUTPATIENT PHYSICAL THERAPY TREATMENT   Patient Name: Amber Ware MRN: [id number] DOB:1981/07/15, 43 y.o., female Today's Date: 05/08/2024  END OF SESSION:  PT End of Session - 05/08/24 0754     Visit Number 6    Number of Visits 17    Date for PT Re-Evaluation 05/19/24    Authorization Type [employer]    Authorization Time Period approved 6 PT visits from 04/21/24-06/19/24    Authorization - Visit Number 3    Authorization - Number of Visits 6    PT Start Time 0800    PT Stop Time 0840    PT Time Calculation (min) 40 min               Past Medical History:  Diagnosis Date   Abnormal Pap smear of cervix 2014   Polycystic ovarian syndrome    Past Surgical History:  Procedure Laterality Date   CERVICAL BIOPSY  W/ LOOP ELECTRODE EXCISION  2014   COLPOSCOPY  2014   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   Liver masses 08/24/2019   Varicose veins of bilateral lower extremities with other complications 01/18/2018   Spider veins of limb 01/18/2018   GAD (generalized anxiety disorder) 01/14/2018   Decreased sex drive 88/72/7981   Excessive hair growth 11/10/2016   Irregular menstrual cycle 11/10/2016    PCP: Katheen Roselie Rockford, NP  REFERRING PROVIDER: Leigh Venetia CROME, MD   REFERRING DIAG: M54.2 (ICD-10-CM) - Cervicalgia   THERAPY DIAG:  Cervicalgia  Muscle weakness (generalized)  Abnormal posture  Rationale for Evaluation and Treatment: Rehabilitation  ONSET DATE: Chronic  SUBJECTIVE:                                                                                                                                                                                                         SUBJECTIVE STATEMENT: Pt presents to PT with no  current reports of pain. Had discomfort the other day thinking she slept wrong but it dissipated quickly.   EVAL: Pt presents to PT with reports of chronic hx of neck pain and HA symptoms. Notes that she changed jobs and has to do a lot less lifting [place] which seems to improve symptoms. Has occasional N/T in R hand and has a lot of trouble sleeping comfortably. Pain is greater in R than L with both neck and HA symptoms.  Denies b/b changes or saddle anesthesia.   Hand dominance: Left  PERTINENT HISTORY:  Chronic Neck Pain and HA  PAIN:  Are you having pain?  Yes: NPRS scale: 0/10 Worst: 7/10 Pain location: neck, upper trap, levator Pain description: sharp, tight, HA Aggravating factors: lifting, driving Relieving factors: medication  PRECAUTIONS: None  RED FLAGS: None     WEIGHT BEARING RESTRICTIONS: No  FALLS:  Has patient fallen in last 6 months? No  LIVING ENVIRONMENT: Lives with: lives with their family Lives in: House/apartment  OCCUPATION: valet at [employer]  PLOF: Independent  PATIENT GOALS: decrease neck pain and HA symptoms with work and community activites  NEXT MD VISIT: 07/06/2024  OBJECTIVE:  Note: Objective measures were completed at Evaluation unless otherwise noted.  DIAGNOSTIC FINDINGS:  N/A  PATIENT SURVEYS:  NDI: 26/50 - 52% disability  COGNITION: Overall cognitive status: Within functional limits for tasks assessed  SENSATION: WFL  POSTURE: rounded shoulders and forward head  PALPATION: TTP to cervical paraspinals and bilateral upper trap   CERVICAL ROM:   Active ROM A/PROM (deg) eval  Flexion   Extension   Right lateral flexion   Left lateral flexion   Right rotation 70  Left rotation 55   (Blank rows = not tested)  UPPER EXTREMITY MMT:  MMT Right eval Left eval  Shoulder flexion    Shoulder extension    Shoulder abduction    Shoulder adduction    Shoulder extension    Shoulder internal rotation    Shoulder  external rotation    Middle trapezius 3/5 3/5  Lower trapezius 3/5 3/5  Grip strength     (Blank rows = not tested)   FUNCTIONAL TESTS:  DNF endurance test: 12 seconds  TREATMENT: [facility] Adult PT Treatment:                                                DATE: 05/08/2024 UBE lvl 1.5 x 3 min for functional activity tolerance FM row 3x10 17# Seated low row 2x10 25# Seated lat pulldown 2x10 25# Chin tuck x 10 - 5 hold POE chin tuck x 10 - 3 hold Prone Y/T/I 2x10 each 2# Prone W with ext 2x10  Standing chin tuck with ball 2x10 - 5 hold Standing horizontal abd with chin tuck against wall x 10 GTB Manual Therapy: Suboccipital release Positional release to bilateral upper trap  [facility] Adult PT Treatment:                                                DATE: 04/25/2024 UBE lvl 1.5 x 3 min for functional activity tolerance Chin tuck x 10 - 5 hold POE chin tuck x 10 Prone Y/T/I x 10 each 2# Prone W with ext x 10  FM row 3x12 17# Seated low row 2x10 25# Seated lat pulldown 2x10 25# Serratus wall slide with foam 2x10 YTB Standing chin tuck with ball x 10 - 5 hold Manual Therapy: Suboccipital release Positional release to bilateral upper trap  [facility] Adult PT Treatment:  DATE: 04/21/2024 UBE lvl 1.5 x 3 min for functional activity tolerance Chin tuck x 10 - 5 hold Supine horizontal abd 2x15 GTB POE chin tuck x 10 Prone Y/T/I x 10 each 1# FM row 3x10 17# Seated low row 2x10 25# Seated lat pulldown 2x10 25# Standing chin tuck with ball x 10 - 5 hold Manual Therapy: Suboccipital release Positional release to bilateral upper trap  [facility] Adult PT Treatment:                                                DATE: 04/14/2024 UBE lvl 1.5 x 3 min for functional activity tolerance Chin tuck x 10 - 5 hold Supine horizontal abd 2x15 RTB Supine shoulder flexion 2x10 2# R Prone Y/T/W x 10 each Prone I 2x10 2# Seated bilateral ER 2x15 RTB FM row  3x10 17# Manual Therapy: Suboccipital release Positional release to bilateral upper trap  [facility] Adult PT Treatment:                                                DATE: 04/07/2024 UBE lvl 1.0 x 3 min for functional activity tolerance Chin tuck 2x10 - 5 hold Supine horizontal abd 2x15 RTB Seated bilateral ER 2x15 RTB Standing row 2x15 blue band Manual Therapy: Suboccipital release Positional release to bilateral upper trap STM and TPR to R upper trap and R levator  [facility] Adult PT Treatment:                                                DATE: 03/24/2024 Therapeutic Exercise: Upper trap stretch x 30 L Chin tuck x 5 - 5 hold Supine pec stretch in 90/90 with towel x 60 Prone I/T x 5 each Standing row x 10 blue band Manual Therapy: Suboccipital release Positional release to bilateral upper trap  PATIENT EDUCATION:  Education details: eval findings, NDI, HEP, POC Person educated: Patient Education method: Explanation, Demonstration, and Handouts Education comprehension: verbalized understanding and returned demonstration  HOME EXERCISE PROGRAM: Access Code: [email or web address] URL: [email or web address] Date: 04/07/2024 Prepared by: Alm Kingdom  Exercises - Seated Upper Trapezius Stretch  - 1 x daily - 7 x weekly - 3 reps - 30 sec hold - Supine Chin Tuck  - 1 x daily - 7 x weekly - 2 sets - 10 reps - 3 sec hold - Open Book Chest Rotation Stretch on Foam 1/2 Roll  - 1 x daily - 7 x weekly - 2 reps - 60 sec hold - Prone I  - 1 x daily - 7 x weekly - 2 sets - 10 reps - Prone Scapular Retraction Arms at Side  - 1 x daily - 7 x weekly - 2 sets - 10 reps - Standing Shoulder Row with Anchored Resistance  - 1 x daily - 7 x weekly - 3 sets - 10 reps - blue band hold - Supine Shoulder Horizontal Abduction with Resistance  - 1 x daily - 7 x weekly - 2 sets - 15 reps - red band hold  ASSESSMENT:  CLINICAL IMPRESSION: Pt was able to complete  all prescribed exercises with no adverse  effects. Today we continued to progress DNF and periscapular strengthening for improved postural endurance and decreasing pain. She will perform HEP for two weeks and we will assess all LTGs at that next session. If she continues to improve possible d/c at that time.   EVAL: Patient is a 43 y.o. F who was seen today for physical therapy evaluation and treatment for chronic neck pain and HA. Physical findings are consistent with MD impresson as pt demonstrates decrease in DNF endurance and TTP cervical musculature. NDI score indicates moderate disability in performance of home ADLs and community activities. Pt would benefit from skilled PT services working on improving postural endurance and manual for decreasing neck and HA symptoms.   OBJECTIVE IMPAIRMENTS: decreased activity tolerance, decreased endurance, decreased mobility, decreased ROM, decreased strength, postural dysfunction, and pain  ACTIVITY LIMITATIONS: carrying, lifting, sitting, standing, and reach over head  PARTICIPATION LIMITATIONS: driving, shopping, community activity, occupation, and yard work  PERSONAL FACTORS: Time since onset of injury/illness/exacerbation are also affecting patient's functional outcome.   REHAB POTENTIAL: Good  CLINICAL DECISION MAKING: Stable/uncomplicated  EVALUATION COMPLEXITY: Low   GOALS: Goals reviewed with patient? No  SHORT TERM GOALS: Target date: 04/14/2024   Pt will be compliant and knowledgeable with initial HEP for improved comfort and carryover Baseline: initial HEP given  Goal status: MET  2.  Pt will self report neck pain no greater than 5/10 for improved comfort and functional ability Baseline: 7/10 at worst Goal status: MET   LONG TERM GOALS: Target date: 05/19/2024   Pt will decrease NDI disability score to no greater than 30% (15/50) as proxy for functional improvement Baseline: 52% disability (26/50) Goal status: INITIAL   2.  Pt will self report neck pain no greater  than 1-2/10 for improved comfort and functional ability Baseline: 7/10 at worst Goal status: INITIAL   3.  Pt will improve DNF endurance to at least 30 seconds for improved strength and postural endurance to decrease neck pain Baseline: 12 sec Goal status: INITIAL  4.  Pt will improve L cervical rotation to at least 70 degrees for improved comfort and function with driving Baseline: 55 degrees Goal status: INITIAL  5.  Pt will improve bilateral middle/lower trap to no less than 4/5 for improved postural endurance and decrease pain and HA symptoms Baseline: 3/5 Goal status: INITIAL   PLAN:  PT FREQUENCY: 1-2x/week  PT DURATION: 8 weeks  PLANNED INTERVENTIONS: 97164- PT Re-evaluation, 97110-Therapeutic exercises, 97530- Therapeutic activity, [id number]- Neuromuscular re-education, 97535- Self Care, [id number]- Manual therapy, [id number]- Electrical stimulation (unattended), [id number]- Electrical stimulation (manual), Dry Needling, Cryotherapy, and Moist heat  PLAN FOR NEXT SESSION: assess HEP response, DNF and periscapular strengthening, manual therapy   Alm JAYSON Kingdom, PT 05/08/2024, 8:43 AM   For all possible CPT codes, reference the Planned Interventions line above.     Check all conditions that are expected to impact treatment: {Conditions expected to impact treatment:Musculoskeletal disorders   If treatment provided at initial evaluation, no treatment charged due to lack of authorization.

## 2024-05-19 ENCOUNTER — Encounter

## 2024-05-22 ENCOUNTER — Ambulatory Visit

## 2024-05-22 DIAGNOSIS — M6281 Muscle weakness (generalized): Secondary | ICD-10-CM | POA: Diagnosis not present

## 2024-05-22 DIAGNOSIS — R293 Abnormal posture: Secondary | ICD-10-CM | POA: Diagnosis not present

## 2024-05-22 DIAGNOSIS — M542 Cervicalgia: Secondary | ICD-10-CM | POA: Diagnosis not present

## 2024-05-22 NOTE — Therapy (Addendum)
 OUTPATIENT PHYSICAL THERAPY TREATMENT/DISCHARGE  PHYSICAL THERAPY DISCHARGE SUMMARY  Visits from Start of Care: 7  Current functional level related to goals / functional outcomes: See goals and objective   Remaining deficits: See goals and objective   Education / Equipment: HEP   Patient agrees to discharge. Patient goals were met. Patient is being discharged due to meeting the stated rehab goals.   Patient Name: Amber Ware MRN: [id number] DOB:07-18-1981, 43 y.o., female Today's Date: 05/22/2024  END OF SESSION:  PT End of Session - 05/22/24 0839     Visit Number 7    Number of Visits 17    Date for PT Re-Evaluation 05/19/24    Authorization Type [employer]    Authorization Time Period approved 6 PT visits from 04/21/24-06/19/24    Authorization - Visit Number 4    Authorization - Number of Visits 6    PT Start Time 0845    PT Stop Time 0925    PT Time Calculation (min) 40 min    Activity Tolerance Patient tolerated treatment well    Behavior During Therapy [facility] for tasks assessed/performed                Past Medical History:  Diagnosis Date   Abnormal Pap smear of cervix 2014   Polycystic ovarian syndrome    Past Surgical History:  Procedure Laterality Date   CERVICAL BIOPSY  W/ LOOP ELECTRODE EXCISION  2014   COLPOSCOPY  2014   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   Liver masses 08/24/2019   Varicose veins of bilateral lower extremities with other complications 01/18/2018   Spider veins of limb 01/18/2018   GAD (generalized anxiety disorder) 01/14/2018   Decreased sex drive 88/72/7981   Excessive hair growth 11/10/2016   Irregular menstrual cycle 11/10/2016    PCP: Katheen Roselie Rockford, NP  REFERRING PROVIDER: Leigh Venetia CROME, MD   REFERRING DIAG: M54.2 (ICD-10-CM) - Cervicalgia   THERAPY DIAG:  Cervicalgia  Muscle weakness (generalized)  Abnormal posture  Rationale for Evaluation and Treatment: Rehabilitation  ONSET DATE: Chronic  SUBJECTIVE:                                                                                                                                                                                                         SUBJECTIVE STATEMENT: Pt presents to PT with reports  of migraine over the weekend but overall she has been doing very well. Feels like she should continue to improve with HEP compliance and is ready to discharge.  EVAL: Pt presents to PT with reports of chronic hx of neck pain and HA symptoms. Notes that she changed jobs and has to do a lot less lifting [place] which seems to improve symptoms. Has occasional N/T in R hand and has a lot of trouble sleeping comfortably. Pain is greater in R than L with both neck and HA symptoms. Denies b/b changes or saddle anesthesia.   Hand dominance: Left  PERTINENT HISTORY:  Chronic Neck Pain and HA  PAIN:  Are you having pain?  Yes: NPRS scale: 0/10 Worst: 7/10 Pain location: neck, upper trap, levator Pain description: sharp, tight, HA Aggravating factors: lifting, driving Relieving factors: medication  PRECAUTIONS: None  RED FLAGS: None     WEIGHT BEARING RESTRICTIONS: No  FALLS:  Has patient fallen in last 6 months? 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  DIAGNOSTIC FINDINGS:  N/A  PATIENT SURVEYS:  NDI: 26/50 - 52% disability  COGNITION: Overall cognitive status: Within functional limits  for tasks assessed  SENSATION: WFL  POSTURE: rounded shoulders and forward head  PALPATION: TTP to cervical paraspinals and bilateral upper trap   CERVICAL ROM:   Active ROM A/PROM (deg) eval AROM 05/22/24  Flexion    Extension    Right lateral flexion    Left lateral flexion    Right rotation 70   Left rotation 55 72   (Blank rows = not tested)  UPPER EXTREMITY MMT:  MMT Right eval Left eval Right 05/22/24 Left 05/22/24  Shoulder flexion      Shoulder extension      Shoulder abduction      Shoulder adduction      Shoulder extension      Shoulder internal rotation      Shoulder external rotation      Middle trapezius 3/5 3/5 4/5 4/5  Lower trapezius 3/5 3/5 4/5 4/5  Grip strength       (Blank rows = not tested)   FUNCTIONAL TESTS:  DNF endurance test: 12 seconds 05/22/2024: 15 seconds  TREATMENT: [facility] Adult PT Treatment:                                                DATE: 05/22/2024 UBE lvl 1.5 x 3 min for functional activity tolerance Prone Y/T/I 2x10 each 2# Chin tuck x 10 - 5 hold POE chin tuck x 10 - 3 hold Standing horizontal abd with chin tuck against wall 2x10 blue band Row 2x12 blue band Assessment of tests/measures, goals, and outcomes for discharge Manual Therapy: Suboccipital release Positional release to bilateral upper trap  [facility] Adult PT Treatment:                                                DATE: 05/08/2024 UBE lvl 1.5  x 3 min for functional activity tolerance FM row 3x10 17# Seated low row 2x10 25# Seated lat pulldown 2x10 25# Chin tuck x 10 - 5 hold POE chin tuck x 10 - 3 hold Prone Y/T/I 2x10 each 2# Prone W with ext 2x10  Standing chin tuck with ball 2x10 - 5 hold Standing horizontal abd with chin tuck against wall x 10 GTB Manual Therapy: Suboccipital release Positional release to bilateral upper trap  [facility] Adult PT Treatment:                                                DATE: 04/25/2024 UBE lvl 1.5 x 3 min for  functional activity tolerance Chin tuck x 10 - 5 hold POE chin tuck x 10 Prone Y/T/I x 10 each 2# Prone W with ext x 10  FM row 3x12 17# Seated low row 2x10 25# Seated lat pulldown 2x10 25# Serratus wall slide with foam 2x10 YTB Standing chin tuck with ball x 10 - 5 hold Manual Therapy: Suboccipital release Positional release to bilateral upper trap  [facility] Adult PT Treatment:                                                DATE: 04/21/2024 UBE lvl 1.5 x 3 min for functional activity tolerance Chin tuck x 10 - 5 hold Supine horizontal abd 2x15 GTB POE chin tuck x 10 Prone Y/T/I x 10 each 1# FM row 3x10 17# Seated low row 2x10 25# Seated lat pulldown 2x10 25# Standing chin tuck with ball x 10 - 5 hold Manual Therapy: Suboccipital release Positional release to bilateral upper trap  [facility] Adult PT Treatment:                                                DATE: 04/14/2024 UBE lvl 1.5 x 3 min for functional activity tolerance Chin tuck x 10 - 5 hold Supine horizontal abd 2x15 RTB Supine shoulder flexion 2x10 2# R Prone Y/T/W x 10 each Prone I 2x10 2# Seated bilateral ER 2x15 RTB FM row 3x10 17# Manual Therapy: Suboccipital release Positional release to bilateral upper trap  [facility] Adult PT Treatment:                                                DATE: 04/07/2024 UBE lvl 1.0 x 3 min for functional activity tolerance Chin tuck 2x10 - 5 hold Supine horizontal abd 2x15 RTB Seated bilateral ER 2x15 RTB Standing row 2x15 blue band Manual Therapy: Suboccipital release Positional release to bilateral upper trap STM and TPR to R upper trap and R levator  [facility] Adult PT Treatment:                                                DATE: 03/24/2024 Therapeutic Exercise:  Upper trap stretch x 30 L Chin tuck x 5 - 5 hold Supine pec stretch in 90/90 with towel x 60 Prone I/T x 5 each Standing row x 10 blue band Manual Therapy: Suboccipital release Positional release to bilateral  upper trap  PATIENT EDUCATION:  Education details: eval findings, NDI, HEP, POC Person educated: Patient Education method: Explanation, Demonstration, and Handouts Education comprehension: verbalized understanding and returned demonstration  HOME EXERCISE PROGRAM: Access Code: [email or web address] URL: [email or web address] Date: 05/22/2024 Prepared by: Alm Kingdom  Exercises - Seated Upper Trapezius Stretch  - 1 x daily - 7 x weekly - 3 reps - 30 sec hold - Open Book Chest Rotation Stretch on Foam 1/2 Roll  - 1 x daily - 7 x weekly - 2 reps - 60 sec hold - Prone Scapular Retraction Y  - 3 x weekly - 2 sets - 10 reps - 2lbs hold - Prone T  - 3 x weekly - 2 sets - 10 reps - 2lbs hold - Prone I  - 3 x weekly - 2 sets - 10 reps - 2lbs hold - Cervical Retraction Prone on Elbows  - 3 x weekly - 2 sets - 10 reps - 5 sec hold - Supine Chin Tuck  - 3 x weekly - 2 sets - 10 reps - 3 sec hold - Standing Shoulder Horizontal Abduction with Resistance  - 3 x weekly - 3 sets - 10 reps - blue band hold - Standing Shoulder Row with Anchored Resistance  - 3 x weekly - 3 sets - 12 reps - blue band hold  ASSESSMENT:  CLINICAL IMPRESSION: Pt was able to complete all prescribed exercises with no adverse effect and demonstrated knowledge of HEP. Over the course of PT treatment pt has progressed very well meeting most long term goals and greatly decreasing pain. NDI score shows significant improvement in subjective functional ability. She should continue to improve with HEP compliance, pt is ready to discharge from skilled PT at this time.   EVAL: Patient is a 43 y.o. F who was seen today for physical therapy evaluation and treatment for chronic neck pain and HA. Physical findings are consistent with MD impresson as pt demonstrates decrease in DNF endurance and TTP cervical musculature. NDI score indicates moderate disability in performance of home ADLs and community activities. Pt would benefit from  skilled PT services working on improving postural endurance and manual for decreasing neck and HA symptoms.   OBJECTIVE IMPAIRMENTS: decreased activity tolerance, decreased endurance, decreased mobility, decreased ROM, decreased strength, postural dysfunction, and pain  ACTIVITY LIMITATIONS: carrying, lifting, sitting, standing, and reach over head  PARTICIPATION LIMITATIONS: driving, shopping, community activity, occupation, and yard work  PERSONAL FACTORS: Time since onset of injury/illness/exacerbation are also affecting patient's functional outcome.   REHAB POTENTIAL: Good  CLINICAL DECISION MAKING: Stable/uncomplicated  EVALUATION COMPLEXITY: Low   GOALS: Goals reviewed with patient? No  SHORT TERM GOALS: Target date: 04/14/2024   Pt will be compliant and knowledgeable with initial HEP for improved comfort and carryover Baseline: initial HEP given  Goal status: MET  2.  Pt will self report neck pain no greater than 5/10 for improved comfort and functional ability Baseline: 7/10 at worst Goal status: MET   LONG TERM GOALS: Target date: 05/19/2024   Pt will decrease NDI disability score to no greater than 30% (15/50) as proxy for functional improvement Baseline: 52% disability (26/50) 05/22/2024: 26% disability (13/50) Goal status: MET   2.  Pt will self report neck  pain no greater than 1-2/10 for improved comfort and functional ability Baseline: 7/10 at worst 05/22/2024: 1-2/10 Goal status: MET   3.  Pt will improve DNF endurance to at least 30 seconds for improved strength and postural endurance to decrease neck pain Baseline: 12 sec 05/22/2024: 15 sec Goal status: Not Met  4.  Pt will improve L cervical rotation to at least 70 degrees for improved comfort and function with driving Baseline: 55 degrees 05/22/2024: 72 degrees Goal status: MET  5.  Pt will improve bilateral middle/lower trap to no less than 4/5 for improved postural endurance and decrease pain and HA  symptoms Baseline: 3/5 05/22/2024: 4/5 Goal status: MET   PLAN:  PT FREQUENCY: 1-2x/week  PT DURATION: 8 weeks  PLANNED INTERVENTIONS: 97164- PT Re-evaluation, 97110-Therapeutic exercises, 97530- Therapeutic activity, 97112- Neuromuscular re-education, 97535- Self Care, 02859- Manual therapy, G0283- Electrical stimulation (unattended), [id number]- Electrical stimulation (manual), Dry Needling, Cryotherapy, and Moist heat  PLAN FOR NEXT SESSION: assess HEP response, DNF and periscapular strengthening, manual therapy   Alm JAYSON Kingdom, PT 05/22/2024, 9:51 AM   For all possible CPT codes, reference the Planned Interventions line above.     Check all conditions that are expected to impact treatment: {Conditions expected to impact treatment:Musculoskeletal disorders   If treatment provided at initial evaluation, no treatment charged due to lack of authorization.

## 2024-06-20 ENCOUNTER — Encounter: Payer: Self-pay | Admitting: Internal Medicine

## 2024-06-20 ENCOUNTER — Ambulatory Visit: Admitting: Internal Medicine

## 2024-06-20 ENCOUNTER — Ambulatory Visit: Payer: Self-pay

## 2024-06-20 VITALS — BP 130/84 | HR 82 | Temp 98.9°F | Ht 66.5 in | Wt 153.4 lb

## 2024-06-20 DIAGNOSIS — U071 COVID-19: Secondary | ICD-10-CM

## 2024-06-20 LAB — POCT INFLUENZA A/B
Influenza A, POC: NEGATIVE
Influenza B, POC: NEGATIVE

## 2024-06-20 LAB — POC COVID19 BINAXNOW: SARS Coronavirus 2 Ag: POSITIVE — AB

## 2024-06-20 LAB — POCT RAPID STREP A (OFFICE): Rapid Strep A Screen: NEGATIVE

## 2024-06-20 NOTE — Telephone Encounter (Signed)
 FYI Only or Action Required?: FYI only for provider.  Patient was last seen in primary care on 02/11/2023 by Nche, Roselie Rockford, NP.  Called Nurse Triage reporting Migraine.  Symptoms began x 2 weeks.  Interventions attempted: Prescription medications: migraine medication, ibuprofen.  Symptoms are: gradually worsening.  Triage Disposition: See Physician Within 24 Hours  Patient/caregiver understands and will follow disposition?: Yes    Copied from CRM #[id number]. Topic: Clinical - Red Word Triage >> Jun 20, 2024  8:03 AM Kathrin PARAS wrote: Red Word that prompted transfer to Nurse Triage: Patient hasn't been feeling well for a while with flu symptoms, severe migraine and has taken the medicatin triptain to relieve the migraine but it hasn't worked. She's also experiencing a sore throat Reason for Disposition  [1] MODERATE headache (e.g., interferes with normal activities) AND [2] present > 24 hours AND [3] unexplained  (Exceptions: Pain medicines not tried, typical migraine, or headache part of viral illness.)  Answer Assessment - Initial Assessment Questions 1. LOCATION: Where does it hurt?      Left temple down to neck making neck tight 2. ONSET: When did the headache start? (e.g., minutes, hours, days)      Friday 3. PATTERN: Does the pain come and go, or has it been constant since it started?     constant 4. SEVERITY: How bad is the pain? and What does it keep you from doing?  (e.g., Scale 1-10; mild, moderate, or severe)     11/10 5. RECURRENT SYMPTOM: Have you ever had headaches before? If Yes, ask: When was the last time? and What happened that time?      yes 6. CAUSE: What do you think is causing the headache?     unknown 7. MIGRAINE: Have you been diagnosed with migraine headaches? If Yes, ask: Is this headache similar?      This was not the same as pt's regular migraine 8. HEAD INJURY: Has there been any recent injury to your head?      no 9.  OTHER SYMPTOMS: Do you have any other symptoms? (e.g., fever, stiff neck, eye pain, sore throat, cold symptoms)     Sore throat, flu like s/s 10. PREGNANCY: Is there any chance you are pregnant? When was your last menstrual period?       na  Protocols used: Headache-A-AH

## 2024-06-20 NOTE — Progress Notes (Signed)
 [facility] PRIMARY CARE [facility] [address] [place] [address] Dept: [phone] Dept Fax: [phone]  Acute Care Office Visit  Subjective:   Amber Ware 05-05-1981 06/20/2024  Chief Complaint  Patient presents with   Fever   URI   Migraine   Sore Throat    Started Friday afternoon     HPI:  Discussed the use of AI scribe software for clinical note transcription with the patient, who gave verbal consent to proceed.  History of Present Illness   She has been feeling unwell since Friday, initially experiencing general fatigue, a raspy voice, and a mild cough. That evening, she developed a severe migraine, which she is prone to, although she has been less frequent recently. She also experienced restless legs, fever, and neck stiffness. She took a triptan, which provided some relief, allowing her to eat and sleep better.  On Saturday, her symptoms persisted, and she experienced another migraine, prompting her to take another triptan. By Sunday, she continued to feel unwell, with symptoms recurring, but was hesitant to take more triptans due to previous advice against frequent use. She managed her symptoms with ibuprofen for aches and pains.  By Monday, she felt slightly better but still had mild aches and a sensation of warmth without a significant fever. She noted a sore throat and a mild cough without productive sputum. Her appetite was decreased, and she experienced nausea without vomiting. She also had an upset stomach with loose stools, which she attributed to drinking body armor and water.  Today she woke up feeling unwell again, with symptoms similar to previous days, including migraines.  Her current medications include triptans for migraines and ibuprofen for general aches and pains. She is cautious about overusing triptans but finds them effective in managing her symptoms.       The following portions of the patient's  history were reviewed and updated as appropriate: past medical history, past surgical history, family history, social history, allergies, medications, and problem list.   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   Liver masses 08/24/2019   Varicose veins of bilateral lower extremities with other complications 01/18/2018   Spider veins of limb 01/18/2018   GAD (generalized anxiety disorder) 01/14/2018   Decreased sex drive 88/72/7981   Excessive hair growth 11/10/2016   Irregular menstrual cycle 11/10/2016   Past Medical History:  Diagnosis Date   Abnormal Pap smear of cervix 2014   Polycystic ovarian syndrome    Past Surgical History:  Procedure Laterality Date   CERVICAL BIOPSY  W/ LOOP ELECTRODE EXCISION  2014   COLPOSCOPY  2014   Family History  Problem Relation Age of Onset   Breast cancer Mother 23       still alive   Thyroid  disease Mother    Hypertension Father    Breast cancer Maternal Aunt 65   Breast cancer Maternal Aunt 72    Current Outpatient Medications:    cholecalciferol (VITAMIN D3) 25 MCG (1000 UNIT) tablet, Take 1,000 Units by mouth daily., Disp: , Rfl:    magnesium 30 MG tablet, Take 250 mg by mouth once., Disp: , Rfl:    norethindrone-ethinyl estradiol -FE (LOESTRIN FE) 1-20 MG-MCG tablet, Take 1 tablet by mouth daily. Take ACTIVE pills only, skipping PLACEBO pills, Disp: 112 tablet, Rfl: 3  nortriptyline  (PAMELOR ) 10 MG capsule, Take 2 capsules (20 mg total) by mouth at bedtime. Take 1 capsule (10 mg) at bedtime for 1 week, then increase to 2 capsules (20 mg) at bedtime thereafter, Disp: 60 capsule, Rfl: 5   ondansetron  (ZOFRAN -ODT) 4 MG disintegrating tablet, Take 1 tablet (4 mg total) by mouth every 8 (eight) hours as needed., Disp: 20 tablet, Rfl:  3   rizatriptan  (MAXALT ) 10 MG tablet, Take 1 tablet (10 mg total) by mouth as needed for migraine. May repeat in 2 hours if needed, Disp: 10 tablet, Rfl: 5 No Known Allergies   ROS: A complete ROS was performed with pertinent positives/negatives noted in the HPI. The remainder of the ROS are negative.    Objective:   Today's Vitals   06/20/24 0952  BP: 130/84  Pulse: 82  Temp: 98.9 F (37.2 C)  TempSrc: Temporal  SpO2: 100%  Weight: 153 lb 6.4 oz (69.6 kg)  Height: 5' 6.5 (1.689 m)    GENERAL: Well-appearing, in NAD. Well nourished.  SKIN: Pink, warm and dry. No rash.  HEENT:    HEAD: Normocephalic, non-traumatic.  EYES: Conjunctive pink without exudate. PERRL.  EARS: External ear w/o redness, swelling, masses, or lesions. EAC clear. TM's intact, translucent w/o bulging, appropriate landmarks visualized.  NOSE: Septum midline w/o deformity. Nares patent, mucosa pink and non-inflamed w/o drainage. No sinus tenderness.  THROAT: Uvula midline. Oropharynx clear. Tonsils non-inflamed w/o exudate . Mucus membranes pink and moist.  NECK: Trachea midline. Full ROM w/o pain or tenderness. Enlarged bilateral submandibular lymph nodes RESPIRATORY: Chest wall symmetrical. Respirations even and non-labored. Breath sounds clear to auscultation bilaterally.  CARDIAC: S1, S2 present, regular rate and rhythm. Peripheral pulses 2+ bilaterally.  EXTREMITIES: Without clubbing, cyanosis, or edema.  NEUROLOGIC: Steady, even gait.  PSYCH/MENTAL STATUS: Alert, oriented x 3. Cooperative, appropriate mood and affect.    Results for orders placed or performed in visit on 06/20/24  POC COVID-19 BinaxNow  Result Value Ref Range   SARS Coronavirus 2 Ag Positive (A) Negative  POCT Influenza A/B  Result Value Ref Range   Influenza A, POC Negative Negative   Influenza B, POC Negative Negative  POCT rapid strep A  Result Value Ref Range   Rapid Strep A Screen Negative Negative      Assessment &  Plan:  Assessment and Plan    COVID-19 infection Positive COVID-19 test with symptoms including migraine, sore throat, cough, nausea, diarrhea, and decreased appetite. Discussed symptomatic treatment and potential use of Paxlovid to shorten symptom duration and severity. Discussed triptans for migraine management and avoiding overuse. Advised monitoring for severe symptoms requiring ER visit. - Recommend symptomatic treatment with OTC cough syrups, rest, and fluids. - Consider Zyrtec or Flonase for congestion if needed. - Use salt water gargles or chloraseptic spray for sore throat. - Monitor for severe symptoms such as difficulty breathing, wheezing, or chest pain and go to the ER if she occurs. - Discuss the option of starting Paxlovid within the first five days of symptoms to reduce severity and duration of COVID-19 symptoms, declines at this time. - Advise alternating ibuprofen and Tylenol to manage headaches and avoid overuse of triptans. - Recommend staying home from work until symptoms are improving. Must be fever free for 24 hours without medication. Declines work note at this time.      Orders Placed This Encounter  Procedures   POC COVID-19 BinaxNow   POCT Influenza A/B   POCT rapid strep A  Lab Orders         POC COVID-19 BinaxNow         POCT Influenza A/B         POCT rapid strep A     No images are attached to the encounter or orders placed in the encounter.  Return if symptoms worsen or fail to improve.   Rosina Senters, FNP

## 2024-06-20 NOTE — Patient Instructions (Addendum)
Rest, drink plenty of fluids.  Tylenol for fever, body aches.   For cough: Take Mucinex DM or Robitussin-DM over-the-counter.  Follow the instructions in the box.  For nasal and sinus congestion: Use over-the-counter Flonase: 2 nasal sprays on each side of the nose in the morning until you feel better  Use over-the-counter Astepro 2 nasal sprays on each side of the nose twice daily until better  Patients without high blood pressure: can use decongestants such as pseudoephedrine or phenylephrine no more than 3-5 days.   Call if not gradually better over the next  10 days  Call anytime if the symptoms are severe, you have high fever, short of breath, chest pain

## 2024-06-28 NOTE — Progress Notes (Signed)
 NEUROLOGY FOLLOW UP OFFICE NOTE  Amber Ware [id number]  Subjective:  Amber Ware is a 43 y.o. year old left-handed female with a medical history of PCOS who we last saw on 03/09/24 for headaches.  To briefly review: 03/09/24: Patient's headaches started about 4-5 years ago. She has pain in the right forehead which she describes as a golf ball trying to come out of her head. She also has pain in the right temple that feels like a spike. She also gets pain in the base of her right head and neck that feels tight and burning type pain. With severe headaches, she will have associated photophobia, phonophobia, and nausea and vomiting. She has a headache every day, but the severity differs. When severe, she rates the pain 9-10/10. She gets this severe headache 1-2 times per month. The less severe, more daily headaches are 5-6/10. When able, she will lay in a dark room and try to sleep off symptoms. She gets blurry vision, but denies vision loss or clear aura.   She has significant neck and trap tightness. She was working in a physical job and had to quit those due to headaches (about 2.5 years ago). She was way worse back then. Her symptoms described above are 80% better than prior. She has done PT x3 and dry needling in the past (last dry needling was 3 years ago, PT about 2 years ago). She is not sure this helped. She still does home exercises some, but none have been done since stopping the physical work.   She has very poor sleep. She tosses and turns throughout the night due to pinching and burning and pain in the shoulders. She will sometimes wake with a headache as well. She will put a balm on her shoulders for this.  She has tried ibuprofen, but it only works for headaches 50% of the time. She will take this 2-3 times per week. She has tried other people's gabapentin, that can help her sleep, but not with pain.   She has tried Sumatriptan  in the past, but this did not help. She would  wait to see if the pain got worse though before taking. She has taken muscle relaxers but she did not feel well on them. She has never been on a preventative medication. She takes magnesium and vit D supplements.   Smoker: No OCP use: Loestrin (does have low dose estrogen) Caffiene use: 1 cup of coffee of week EtOH use: None Restrictive diet: No Family history of neurologic disease, including: Mother with migraines  Most recent Assessment and Plan (03/09/24): Amber Ware is a 43 y.o. female who presents for evaluation of headaches and neck pain/tightness. She has a relevant medical history of PCOS. Her neurological examination is normal today. She does have very tight and tender trapezius and cervical paraspinal muscles on general examination. Available diagnostic data is significant for normal TSH and B12 and low vit D. Patient's headaches are consistent with chronic migraine without aura. She has daily headaches (about 30 per month) with one very severe headache monthly. She also likely has contributions from cervicalgia. She has tried PT in the remote past, but also had a very physical job at the time that may have contributed to her not improving. She is willing to try again as her neck pain and tightness is debilitating.   PLAN: -Continue Vit D supplementation -Physical therapy for cervicalgia -For migraines: Migraine prevention:  Start nortriptyline  10 mg at bedtime for 1 week, 20 mg at  bedtime thereafter Migraine rescue:  Start Maxalt  10 mg as needed at headache onset, can repeat after 2 hours. Zofran  4 mg ODT as needed for nausea Limit use of pain relievers to no more than 2 days out of week to prevent risk of rebound or medication-overuse headache. Keep headache diary  Since their last visit: She is feeling better than previous today. She is now having less headaches. She will have one severe headache a month but less intense. The PT really helped her neck and shoulders. She has  less daily pressure. Maxalt  will resolve her headache within 1 hour (taking once per month now). She has rarely used zofran  for nausea.  She is only takine nortriptyline  10 mg at bedtime as when she went to 20 mg at bedtime it caused restless sleep, so she cut back to 10 mg.  MEDICATIONS:  Outpatient Encounter Medications as of 07/06/2024  Medication Sig   cholecalciferol (VITAMIN D3) 25 MCG (1000 UNIT) tablet Take 1,000 Units by mouth daily.   magnesium 30 MG tablet Take 250 mg by mouth once.   norethindrone-ethinyl estradiol -FE (LOESTRIN FE) 1-20 MG-MCG tablet Take 1 tablet by mouth daily. Take ACTIVE pills only, skipping PLACEBO pills   nortriptyline  (PAMELOR ) 10 MG capsule Take 2 capsules (20 mg total) by mouth at bedtime. Take 1 capsule (10 mg) at bedtime for 1 week, then increase to 2 capsules (20 mg) at bedtime thereafter   ondansetron  (ZOFRAN -ODT) 4 MG disintegrating tablet Take 1 tablet (4 mg total) by mouth every 8 (eight) hours as needed.   rizatriptan  (MAXALT ) 10 MG tablet Take 1 tablet (10 mg total) by mouth as needed for migraine. May repeat in 2 hours if needed   No facility-administered encounter medications on file as of 07/06/2024.    PAST MEDICAL HISTORY: Past Medical History:  Diagnosis Date   Abnormal Pap smear of cervix 2014   Polycystic ovarian syndrome     PAST SURGICAL HISTORY: Past Surgical History:  Procedure Laterality Date   CERVICAL BIOPSY  W/ LOOP ELECTRODE EXCISION  2014   COLPOSCOPY  2014    ALLERGIES: No Known Allergies  FAMILY HISTORY: Family History  Problem Relation Age of Onset   Breast cancer Mother 64       still alive   Thyroid  disease Mother    Hypertension Father    Breast cancer Maternal Aunt 29   Breast cancer Maternal Aunt 66    SOCIAL HISTORY: Social History   Tobacco Use   Smoking status: Former    Types: Cigarettes   Smokeless tobacco: Never  Vaping Use   Vaping status: Never Used  Substance Use Topics   Alcohol use:  No   Drug use: Yes    Types: Marijuana    Comment: at night to sleep   Social History   Social History Narrative   Are you right handed or left handed? left   Are you currently employed ?    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    Who lives with you? boyfriend   What type of home do you live in: 1 story or 2 story? one    Caffiene rarely      Objective:  Vital Signs:  BP 128/87   Pulse 77   Ht 5' 6.5 (1.689 m)   Wt 160 lb (72.6 kg)   SpO2 99%   BMI 25.44 kg/m   General: No acute distress.  Patient appears well-groomed.  Head:  Normocephalic/atraumatic Eyes:  Fundi examined, disc margins clear, no obvious papilledema Neck: supple, no paraspinal tenderness, improved range of motion Heart:  Regular rate and rhythm Lungs:  Clear to auscultation bilaterally Neurological Exam: alert and oriented.  Speech fluent and not dysarthric, language intact.  CN II-XII intact. Bulk and tone normal, muscle strength 5/5 throughout.  Sensation to light touch intact.  Deep tendon reflexes 2+ throughout.  Finger to nose testing intact.  Gait normal.   Labs and Imaging review: New results: COVID (06/20/24): positive  Previously reviewed results: 01/20/23: Vit D low at 28 TSH wnl B12: 398 CMP unremarkable CBC w/ diff unremarkable   Imaging/Procedures: MRI cervical spine wo contrast (09/15/22): IMPRESSION: No neural compressive finding in the cervical region. Minimal annular bulging at C5-6 without compressive narrowing of the canal or foramina. Minimal uncovertebral prominence at C6-7 without compressive stenosis.  Assessment/Plan:  This is Amber Ware, a 43 y.o. female with: Migraine without aura - has about 1 severe headache per month with milder dull headaches mixed in throughout the month. Her headaches have much improved with nortriptyline  10 mg at bedtime though did not like 20 mg as she felt it hurt her sleep. PT for her neck also  helped. Cervicalgia Vit D deficiency - on supplementation   Plan: Migraine prevention:  Continue nortriptyline  10 mg at bedtime, can increase to 20 mg at bedtime if needed and tolerated Migraine rescue:  Continue rizatriptan  10 mg as needed at headache onset, can repeat in 2 hours if needed. Zofran  4 mg ODT as needed for nausea Limit use of pain relievers to no more than 2 days out of week to prevent risk of rebound or medication-overuse headache. Continue home PT exercises for cervicalgia Keep headache diary  -Continue vitamin D  supplementation  Return to clinic in 6 months   Venetia Potters, MD

## 2024-07-06 ENCOUNTER — Encounter: Payer: Self-pay | Admitting: Neurology

## 2024-07-06 ENCOUNTER — Ambulatory Visit: Admitting: Neurology

## 2024-07-06 VITALS — BP 128/87 | HR 77 | Ht 66.5 in | Wt 160.0 lb

## 2024-07-06 DIAGNOSIS — M542 Cervicalgia: Secondary | ICD-10-CM | POA: Diagnosis not present

## 2024-07-06 DIAGNOSIS — R112 Nausea with vomiting, unspecified: Secondary | ICD-10-CM | POA: Diagnosis not present

## 2024-07-06 DIAGNOSIS — G43709 Chronic migraine without aura, not intractable, without status migrainosus: Secondary | ICD-10-CM

## 2024-07-06 DIAGNOSIS — E559 Vitamin D deficiency, unspecified: Secondary | ICD-10-CM | POA: Diagnosis not present

## 2024-07-06 NOTE — Patient Instructions (Addendum)
 Migraine prevention:  Continue nortriptyline  10 mg at bedtime, can increase to 20 mg at bedtime if needed and tolerated Migraine rescue:  Continue rizatriptan  10 mg as needed at headache onset, can repeat in 2 hours if needed. Zofran  4 mg ODT as needed for nausea Limit use of pain relievers to no more than 2 days out of week to prevent risk of rebound or medication-overuse headache. Keep headache diary  Continue vitamin D  supplement  Return to clinic in 6 months  Please let me know if you have any questions or concerns in the meantime.  The physicians and staff at [facility] are committed to providing excellent care. You may receive a survey requesting feedback about your experience at our office. We strive to receive very good responses to the survey questions. If you feel that your experience would prevent you from giving the office a very good  response, please contact our office to try to remedy the situation. We may be reached at [phone]. Thank you for taking the time out of your busy day to complete the survey.  Venetia Potters, MD [facility]

## 2024-10-17 ENCOUNTER — Encounter: Payer: Self-pay | Admitting: Nurse Practitioner

## 2024-10-17 DIAGNOSIS — Z1231 Encounter for screening mammogram for malignant neoplasm of breast: Secondary | ICD-10-CM

## 2024-11-21 ENCOUNTER — Other Ambulatory Visit: Payer: Self-pay | Admitting: Nurse Practitioner

## 2024-11-21 DIAGNOSIS — Z1231 Encounter for screening mammogram for malignant neoplasm of breast: Secondary | ICD-10-CM

## 2024-11-30 ENCOUNTER — Encounter

## 2024-11-30 ENCOUNTER — Ambulatory Visit
Admission: RE | Admit: 2024-11-30 | Discharge: 2024-11-30 | Disposition: A | Discharge status: Home / Self Care | Source: Ambulatory Visit | Attending: Nurse Practitioner | Admitting: Nurse Practitioner

## 2024-11-30 DIAGNOSIS — Z1231 Encounter for screening mammogram for malignant neoplasm of breast: Secondary | ICD-10-CM

## 2024-12-05 ENCOUNTER — Other Ambulatory Visit: Payer: Self-pay | Admitting: Neurology

## 2024-12-05 DIAGNOSIS — G43709 Chronic migraine without aura, not intractable, without status migrainosus: Secondary | ICD-10-CM

## 2024-12-06 NOTE — Telephone Encounter (Signed)
 Called pt and left message to call office about her medication nortriptyline  due that it ran out in Nov 2025

## 2024-12-06 NOTE — Telephone Encounter (Signed)
 Pt is returning call in reference  to medication nortriptyline . Thanks

## 2024-12-07 NOTE — Telephone Encounter (Signed)
 Pt is returning  a call and she stated that she do need the prescription called : nortriptyline  filled. Thanks

## 2024-12-07 NOTE — Telephone Encounter (Signed)
 Called again and left a message to call office.

## 2025-01-10 ENCOUNTER — Ambulatory Visit: Admitting: Neurology

## 2025-02-15 ENCOUNTER — Ambulatory Visit: Admitting: Nurse Practitioner
